# Patient Record
Sex: Female | Born: 1960 | Race: White | Hispanic: No | Marital: Single | Smoking: Former smoker
Health system: Southern US, Community
[De-identification: ages and names within clinical notes are randomized; demographics above are authoritative.]

## PROBLEM LIST (undated history)

## (undated) DIAGNOSIS — D229 Melanocytic nevi, unspecified: Secondary | ICD-10-CM

## (undated) DIAGNOSIS — E785 Hyperlipidemia, unspecified: Secondary | ICD-10-CM

## (undated) DIAGNOSIS — D219 Benign neoplasm of connective and other soft tissue, unspecified: Secondary | ICD-10-CM

## (undated) DIAGNOSIS — R51 Headache: Secondary | ICD-10-CM

## (undated) DIAGNOSIS — M1990 Unspecified osteoarthritis, unspecified site: Secondary | ICD-10-CM

## (undated) DIAGNOSIS — R109 Unspecified abdominal pain: Secondary | ICD-10-CM

## (undated) DIAGNOSIS — E7800 Pure hypercholesterolemia, unspecified: Secondary | ICD-10-CM

## (undated) DIAGNOSIS — F419 Anxiety disorder, unspecified: Secondary | ICD-10-CM

## (undated) HISTORY — DX: Hyperlipidemia, unspecified: E78.5

## (undated) HISTORY — DX: Unspecified osteoarthritis, unspecified site: M19.90

## (undated) HISTORY — DX: Unspecified abdominal pain: R10.9

## (undated) HISTORY — PX: NO PAST SURGERIES: SHX2092

## (undated) HISTORY — DX: Benign neoplasm of connective and other soft tissue, unspecified: D21.9

## (undated) HISTORY — DX: Anxiety disorder, unspecified: F41.9

## (undated) HISTORY — PX: MOLE REMOVAL: SHX2046

## (undated) HISTORY — DX: Melanocytic nevi, unspecified: D22.9

## (undated) HISTORY — DX: Pure hypercholesterolemia, unspecified: E78.00

## (undated) HISTORY — PX: MOUTH SURGERY: SHX715

## (undated) HISTORY — DX: Headache: R51

---

## 2007-02-27 ENCOUNTER — Other Ambulatory Visit: Admission: RE | Admit: 2007-02-27 | Discharge: 2007-02-27 | Payer: Self-pay | Admitting: Obstetrics and Gynecology

## 2008-03-06 ENCOUNTER — Other Ambulatory Visit: Admission: RE | Admit: 2008-03-06 | Discharge: 2008-03-06 | Payer: Self-pay | Admitting: Obstetrics and Gynecology

## 2008-06-12 ENCOUNTER — Ambulatory Visit (HOSPITAL_COMMUNITY): Admission: RE | Admit: 2008-06-12 | Discharge: 2008-06-12 | Payer: Self-pay | Admitting: Obstetrics & Gynecology

## 2009-03-20 ENCOUNTER — Observation Stay (HOSPITAL_COMMUNITY): Admission: EM | Admit: 2009-03-20 | Discharge: 2009-03-21 | Payer: Self-pay | Admitting: Emergency Medicine

## 2009-04-16 ENCOUNTER — Other Ambulatory Visit: Admission: RE | Admit: 2009-04-16 | Discharge: 2009-04-16 | Payer: Self-pay | Admitting: Obstetrics and Gynecology

## 2009-12-22 DIAGNOSIS — D229 Melanocytic nevi, unspecified: Secondary | ICD-10-CM

## 2009-12-22 HISTORY — DX: Melanocytic nevi, unspecified: D22.9

## 2010-04-04 LAB — CBC
HCT: 35.8 % — ABNORMAL LOW (ref 36.0–46.0)
HCT: 37.5 % (ref 36.0–46.0)
MCHC: 35.3 g/dL (ref 30.0–36.0)
MCV: 91.7 fL (ref 78.0–100.0)
RDW: 13.2 % (ref 11.5–15.5)
WBC: 4.9 10*3/uL (ref 4.0–10.5)
WBC: 6.3 10*3/uL (ref 4.0–10.5)

## 2010-04-04 LAB — BASIC METABOLIC PANEL
BUN: 12 mg/dL (ref 6–23)
CO2: 25 mEq/L (ref 19–32)
Calcium: 9.1 mg/dL (ref 8.4–10.5)
Chloride: 106 mEq/L (ref 96–112)
Glucose, Bld: 91 mg/dL (ref 70–99)

## 2010-04-04 LAB — CARDIAC PANEL(CRET KIN+CKTOT+MB+TROPI)
CK, MB: 0.4 ng/mL (ref 0.3–4.0)
CK, MB: 0.5 ng/mL (ref 0.3–4.0)
Total CK: 21 U/L (ref 7–177)
Total CK: 26 U/L (ref 7–177)
Troponin I: <0.01 ng/mL (ref 0.00–0.06)

## 2010-04-04 LAB — DIFFERENTIAL
Basophils Absolute: 0.1 10*3/uL (ref 0.0–0.1)
Eosinophils Absolute: 0 10*3/uL (ref 0.0–0.7)
Eosinophils Absolute: 0.1 10*3/uL (ref 0.0–0.7)
Eosinophils Relative: 1 % (ref 0–5)
Lymphocytes Relative: 19 % (ref 12–46)
Lymphs Abs: 1.2 10*3/uL (ref 0.7–4.0)
Monocytes Absolute: 0.5 10*3/uL (ref 0.1–1.0)
Neutro Abs: 4.5 10*3/uL (ref 1.7–7.7)

## 2010-04-04 LAB — LIPID PANEL: Triglycerides: 170 mg/dL — ABNORMAL HIGH (ref <150)

## 2010-04-04 LAB — COMPREHENSIVE METABOLIC PANEL
AST: 15 U/L (ref 0–37)
Alkaline Phosphatase: 27 U/L — ABNORMAL LOW (ref 39–117)
CO2: 23 mEq/L (ref 19–32)
Calcium: 8.5 mg/dL (ref 8.4–10.5)
Creatinine, Ser: 0.65 mg/dL (ref 0.4–1.2)
GFR calc non Af Amer: >60 mL/min (ref >60)
Sodium: 136 mEq/L (ref 135–145)
Total Protein: 6.3 g/dL (ref 6.0–8.3)

## 2010-04-04 LAB — GLUCOSE, CAPILLARY
Glucose-Capillary: 93 mg/dL (ref 70–99)
Glucose-Capillary: 98 mg/dL (ref 70–99)

## 2010-04-04 LAB — HEMOGLOBIN A1C: Hgb A1c MFr Bld: 5.5 % (ref 4.6–6.1)

## 2010-04-04 LAB — TSH: TSH: 2.547 u[IU]/mL (ref 0.350–4.500)

## 2010-04-04 LAB — POCT CARDIAC MARKERS: Myoglobin, poc: 35.8 ng/mL (ref 12–200)

## 2010-04-04 LAB — T4, FREE: Free T4: 1.38 ng/dL (ref 0.80–1.80)

## 2010-04-22 ENCOUNTER — Other Ambulatory Visit: Payer: Self-pay | Admitting: Adult Health

## 2010-04-22 ENCOUNTER — Other Ambulatory Visit (HOSPITAL_COMMUNITY)
Admission: RE | Admit: 2010-04-22 | Discharge: 2010-04-22 | Disposition: A | Discharge status: Home / Self Care | Payer: Self-pay | Source: Ambulatory Visit | Attending: Obstetrics and Gynecology | Admitting: Obstetrics and Gynecology

## 2010-04-22 DIAGNOSIS — Z01419 Encounter for gynecological examination (general) (routine) without abnormal findings: Secondary | ICD-10-CM | POA: Insufficient documentation

## 2010-09-02 IMAGING — CR DG CHEST 1V PORT
1 series · 1 of 1 positions shown · non-contrast
Comparison: None

CLINICAL DATA: Chest pain

PORTABLE CHEST - 1 VIEW

[view not recorded]
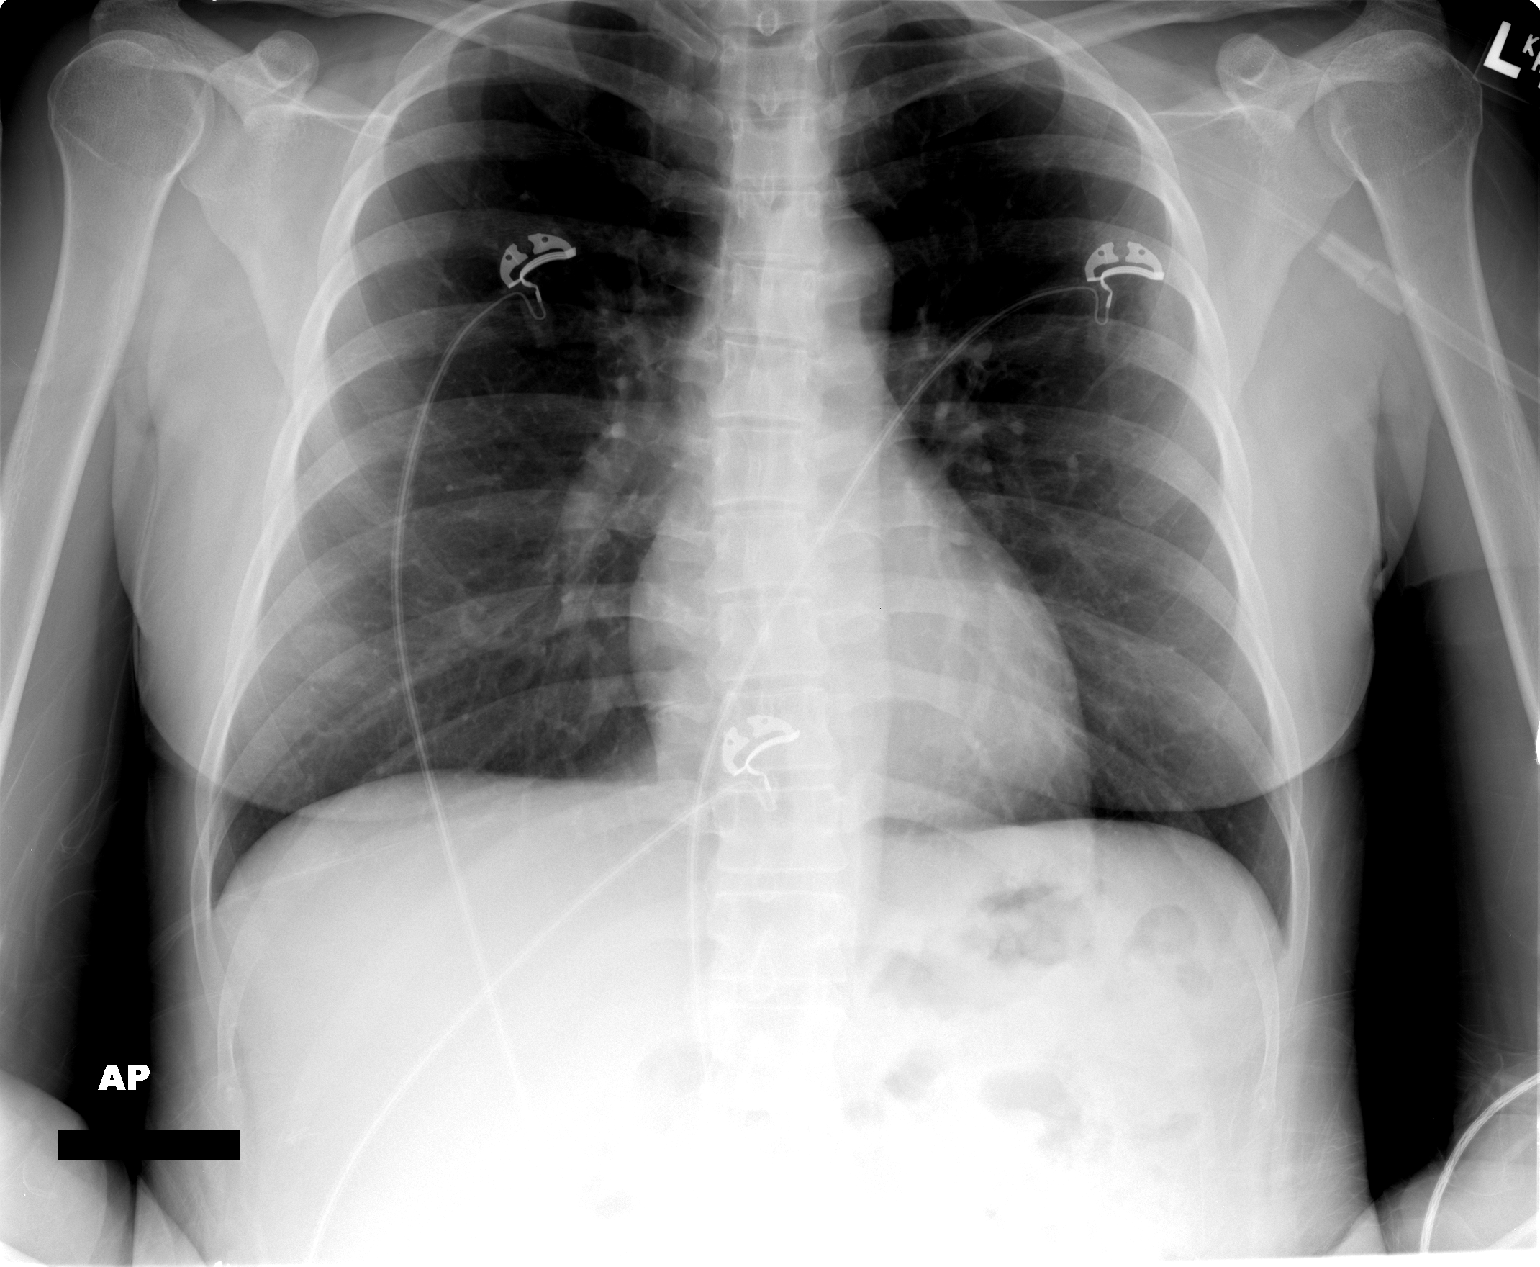

[1 of 1 positions shown; findings below may reference images not displayed]

FINDINGS: The heart size and mediastinal contours are within normal
limits.  Both lungs are clear.  The visualized skeletal structures
are unremarkable.
IMPRESSION: No acute findings.

## 2012-05-09 ENCOUNTER — Encounter: Payer: Self-pay | Admitting: *Deleted

## 2012-05-10 ENCOUNTER — Ambulatory Visit (INDEPENDENT_AMBULATORY_CARE_PROVIDER_SITE_OTHER): Payer: PRIVATE HEALTH INSURANCE | Admitting: Adult Health

## 2012-05-10 ENCOUNTER — Other Ambulatory Visit (HOSPITAL_COMMUNITY)
Admission: RE | Admit: 2012-05-10 | Discharge: 2012-05-10 | Disposition: A | Discharge status: Home / Self Care | Payer: Self-pay | Source: Ambulatory Visit | Attending: Adult Health | Admitting: Adult Health

## 2012-05-10 ENCOUNTER — Encounter: Payer: Self-pay | Admitting: Adult Health

## 2012-05-10 VITALS — BP 120/80 | HR 72 | Ht 61.5 in | Wt 125.0 lb

## 2012-05-10 DIAGNOSIS — Z1151 Encounter for screening for human papillomavirus (HPV): Secondary | ICD-10-CM | POA: Insufficient documentation

## 2012-05-10 DIAGNOSIS — Z01419 Encounter for gynecological examination (general) (routine) without abnormal findings: Secondary | ICD-10-CM | POA: Insufficient documentation

## 2012-05-10 DIAGNOSIS — IMO0001 Reserved for inherently not codable concepts without codable children: Secondary | ICD-10-CM

## 2012-05-10 DIAGNOSIS — E7800 Pure hypercholesterolemia, unspecified: Secondary | ICD-10-CM

## 2012-05-10 DIAGNOSIS — Z8639 Personal history of other endocrine, nutritional and metabolic disease: Secondary | ICD-10-CM

## 2012-05-10 DIAGNOSIS — Z309 Encounter for contraceptive management, unspecified: Secondary | ICD-10-CM

## 2012-05-10 DIAGNOSIS — Z1212 Encounter for screening for malignant neoplasm of rectum: Secondary | ICD-10-CM

## 2012-05-10 HISTORY — DX: Pure hypercholesterolemia, unspecified: E78.00

## 2012-05-10 LAB — HEMOCCULT GUIAC POC 1CARD (OFFICE): Fecal Occult Blood, POC: NEGATIVE

## 2012-05-10 MED ORDER — LEVONORGEST-ETH ESTRAD 91-DAY 0.15-0.03 MG PO TABS: 1.0000 | ORAL_TABLET | Freq: Every day | ORAL | Status: DC

## 2012-05-10 NOTE — Progress Notes (Signed)
Patient ID: Rhonda [name], female   DOB: 1960/08/08, 52 y.o.   MRN: [id number] History of Present Illness: Mareli is a 52 year old white female in for pap and physical.  Current Medications, Allergies, Past Medical History, Past Surgical History, Family History and Social History were reviewed in [employer] electronic medical record.    Review of Systems: Patient denies any headaches, blurred vision, shortness of breath, chest pain, abdominal pain, problems with bowel movements, urination, or intercourse. No joint pain or mood changes.  Physical Exam:Blood pressure 120/80, pulse 72, height 5' 1.5" (1.562 m), weight 125 lb (56.7 kg). General:  Well developed, well nourished, no acute distress Skin:  Warm and dry Neck:  Midline trachea, normal thyroid Lungs; Clear to auscultation bilaterally Breast:  No dominant palpable mass, retraction, or nipple discharge, scar right breast where mole removed recently and it was benign. Cardiovascular: Regular rate and rhythm Abdomen:  Soft, non tender, no hepatosplenomegaly Pelvic:  External genitalia is normal in appearance.  The vagina is normal in appearance. The cervix is bulbous.  Uterus is felt to be normal size, shape, and contour.  No adnexal masses or tenderness noted. Rectal: Good sphincter tone, no polyps, or hemorrhoids felt.  Hemoccult negative. Extremities:  No swelling or varicosities noted Psych:   Pleasant, alert and cooperative   Impression: Yearly exam Contraceptive management History elevated cholesterol History diabetes diet controlled  Plan: Physical in 1 year Mammogram yearly Labs at work Colonoscopy at 52  Refilled [address]

## 2012-05-10 NOTE — Patient Instructions (Addendum)
Follow up 1 year for physical Sign up for my chart Mammogram yearly Labs at work Colonoscopy at 52

## 2012-07-06 ENCOUNTER — Other Ambulatory Visit: Payer: Self-pay | Admitting: Adult Health

## 2013-05-19 ENCOUNTER — Encounter: Payer: Self-pay | Admitting: Adult Health

## 2013-05-19 ENCOUNTER — Ambulatory Visit (INDEPENDENT_AMBULATORY_CARE_PROVIDER_SITE_OTHER): Payer: PRIVATE HEALTH INSURANCE | Admitting: Adult Health

## 2013-05-19 VITALS — BP 120/82 | HR 74 | Ht 61.5 in | Wt 134.0 lb

## 2013-05-19 DIAGNOSIS — Z01419 Encounter for gynecological examination (general) (routine) without abnormal findings: Secondary | ICD-10-CM

## 2013-05-19 DIAGNOSIS — Z1212 Encounter for screening for malignant neoplasm of rectum: Secondary | ICD-10-CM

## 2013-05-19 DIAGNOSIS — Z139 Encounter for screening, unspecified: Secondary | ICD-10-CM

## 2013-05-19 DIAGNOSIS — Z309 Encounter for contraceptive management, unspecified: Secondary | ICD-10-CM

## 2013-05-19 LAB — HEMOCCULT GUIAC POC 1CARD (OFFICE): FECAL OCCULT BLD: NEGATIVE

## 2013-05-19 MED ORDER — LEVONORGEST-ETH ESTRAD 91-DAY 0.15-0.03 MG PO TABS: ORAL_TABLET | ORAL | Status: DC

## 2013-05-19 NOTE — Progress Notes (Signed)
Patient ID: Rhonda [name], female   DOB: 1960/08/28, 53 y.o.   MRN: [id number] History of Present Illness: Rhonda [name] is a 53 year old white female in for a physical,she had a normal pap with negative HPV 05/10/12.   Current Medications, Allergies, Past Medical History, Past Surgical History, Family History and Social History were reviewed in [employer] record.     Review of Systems: Patient denies any headaches, blurred vision, shortness of breath, chest pain, abdominal pain, problems with bowel movements, urination, or intercourse. No joint pain or mood swings.    Physical Exam:BP 120/82  Pulse 74  Ht 5' 1.5" (1.562 m)  Wt 134 lb (60.782 kg)  BMI 24.91 kg/m2 General:  Well developed, well nourished, no acute distress Skin:  Warm and dry Neck:  Midline trachea, normal thyroid Lungs; Clear to auscultation bilaterally Breast:  No dominant palpable mass, retraction, or nipple discharge Cardiovascular: Regular rate and rhythm Abdomen:  Soft, non tender, no hepatosplenomegaly Pelvic:  External genitalia is normal in appearance.  The vagina is normal in appearance.  The cervix is bulbous.  Uterus is felt to be normal size, shape, and contour.  No adnexal masses or tenderness noted. Rectal: Good sphincter tone, no polyps, or hemorrhoids felt.  Hemoccult negative. Extremities:  No swelling or varicosities noted Psych:  No mood changes, alert and cooperative seems happy   Impression: Yearly gyn exam no pap Contraceptive management    Plan: Physical in 1 year Mammogram yearly Labs at work Referred for colonoscopy with Dr Laural Golden Refilled [facility] x 1 year

## 2013-05-19 NOTE — Patient Instructions (Signed)
Physical in 1 year Mammogram yearly Labs at work Referred for colonoscopy

## 2013-05-22 ENCOUNTER — Encounter (INDEPENDENT_AMBULATORY_CARE_PROVIDER_SITE_OTHER): Payer: Self-pay | Admitting: *Deleted

## 2013-07-14 ENCOUNTER — Telehealth: Payer: Self-pay | Admitting: *Deleted

## 2013-07-14 NOTE — Telephone Encounter (Signed)
Needs refill on xanax, wait for PCP to fill but don't run out.

## 2013-10-07 ENCOUNTER — Telehealth: Payer: Self-pay | Admitting: Adult Health

## 2013-10-07 ENCOUNTER — Other Ambulatory Visit: Payer: Self-pay | Admitting: Adult Health

## 2013-10-07 DIAGNOSIS — Z139 Encounter for screening, unspecified: Secondary | ICD-10-CM

## 2013-10-07 NOTE — Telephone Encounter (Signed)
Pt has not heard from Dr Olevia Perches office for colonoscopy, re did referral and gave pt the number to call.

## 2013-10-08 ENCOUNTER — Telehealth: Payer: Self-pay | Admitting: Adult Health

## 2013-10-08 NOTE — Telephone Encounter (Signed)
Told her to call Dr Otelia Limes office to schedule colonoscopy they called and said they sent letter in May with no response

## 2013-10-09 ENCOUNTER — Encounter (INDEPENDENT_AMBULATORY_CARE_PROVIDER_SITE_OTHER): Payer: Self-pay | Admitting: *Deleted

## 2013-10-15 ENCOUNTER — Other Ambulatory Visit (INDEPENDENT_AMBULATORY_CARE_PROVIDER_SITE_OTHER): Payer: Self-pay | Admitting: *Deleted

## 2013-10-15 ENCOUNTER — Encounter (INDEPENDENT_AMBULATORY_CARE_PROVIDER_SITE_OTHER): Payer: Self-pay | Admitting: *Deleted

## 2013-10-15 DIAGNOSIS — Z1211 Encounter for screening for malignant neoplasm of colon: Secondary | ICD-10-CM

## 2013-11-10 ENCOUNTER — Encounter: Payer: Self-pay | Admitting: Adult Health

## 2013-11-12 ENCOUNTER — Telehealth (INDEPENDENT_AMBULATORY_CARE_PROVIDER_SITE_OTHER): Payer: Self-pay | Admitting: *Deleted

## 2013-11-12 DIAGNOSIS — Z1211 Encounter for screening for malignant neoplasm of colon: Secondary | ICD-10-CM

## 2013-11-12 NOTE — Telephone Encounter (Signed)
Patient needs movi prep 

## 2013-11-19 MED ORDER — PEG-KCL-NACL-NASULF-NA ASC-C 100 G PO SOLR: 1.0000 | Freq: Once | ORAL | Status: DC

## 2013-12-11 ENCOUNTER — Encounter (INDEPENDENT_AMBULATORY_CARE_PROVIDER_SITE_OTHER): Payer: Self-pay | Admitting: *Deleted

## 2013-12-17 ENCOUNTER — Encounter: Payer: Self-pay | Admitting: Adult Health

## 2014-02-03 ENCOUNTER — Telehealth (INDEPENDENT_AMBULATORY_CARE_PROVIDER_SITE_OTHER): Payer: Self-pay | Admitting: *Deleted

## 2014-02-03 NOTE — Telephone Encounter (Signed)
agree

## 2014-02-03 NOTE — Telephone Encounter (Signed)
Referring MD: Derrek Monaco, [facility]   PCP: [facility]   Procedure: tcs  Reason/Indication:  screening  Has patient had this procedure before?  no  If so, when, by whom and where?    Is there a family history of colon cancer?  no  Who?  What age when diagnosed?    Is patient diabetic?   Borderline, diet control      Does patient have prosthetic heart valve?  no  Do you have a pacemaker?  no  Has patient ever had endocarditis? no  Has patient had joint replacement within last 12 months?  no  Does patient tend to be constipated or take laxatives? no  Is patient on Coumadin, Plavix and/or Aspirin? no  Medications: Birth control daily, simvastatin 10 mg daily, otc Nexium daily, xanax 0.35 mg prn  Allergies: low tolerance to pain medication  Medication Adjustment:   Procedure date & time: 03/05/14 at 930

## 2014-03-04 ENCOUNTER — Other Ambulatory Visit (INDEPENDENT_AMBULATORY_CARE_PROVIDER_SITE_OTHER): Payer: Self-pay | Admitting: *Deleted

## 2014-03-04 ENCOUNTER — Telehealth: Payer: Self-pay | Admitting: Adult Health

## 2014-03-04 DIAGNOSIS — Z1211 Encounter for screening for malignant neoplasm of colon: Secondary | ICD-10-CM

## 2014-03-04 MED ORDER — SCOPOLAMINE 1 MG/3DAYS TD PT72: 1.0000 | MEDICATED_PATCH | TRANSDERMAL | Status: DC

## 2014-03-04 NOTE — Telephone Encounter (Signed)
Having colonoscopy in am, gets sick with pain meds and dizzy will rx transderm scop patch x 1 put on tonight

## 2014-03-05 ENCOUNTER — Encounter (HOSPITAL_COMMUNITY)
Admission: RE | Disposition: A | Discharge status: Home / Self Care | Payer: Self-pay | Source: Ambulatory Visit | Attending: Internal Medicine

## 2014-03-05 ENCOUNTER — Ambulatory Visit (HOSPITAL_COMMUNITY)
Admission: RE | Admit: 2014-03-05 | Discharge: 2014-03-05 | Disposition: A | Discharge status: Home / Self Care | Payer: PRIVATE HEALTH INSURANCE | Source: Ambulatory Visit | Attending: Internal Medicine | Admitting: Internal Medicine

## 2014-03-05 ENCOUNTER — Encounter (HOSPITAL_COMMUNITY): Payer: Self-pay | Admitting: *Deleted

## 2014-03-05 DIAGNOSIS — E785 Hyperlipidemia, unspecified: Secondary | ICD-10-CM | POA: Insufficient documentation

## 2014-03-05 DIAGNOSIS — Z79899 Other long term (current) drug therapy: Secondary | ICD-10-CM | POA: Diagnosis not present

## 2014-03-05 DIAGNOSIS — Z87891 Personal history of nicotine dependence: Secondary | ICD-10-CM | POA: Insufficient documentation

## 2014-03-05 DIAGNOSIS — Z1211 Encounter for screening for malignant neoplasm of colon: Secondary | ICD-10-CM | POA: Diagnosis present

## 2014-03-05 DIAGNOSIS — F419 Anxiety disorder, unspecified: Secondary | ICD-10-CM | POA: Insufficient documentation

## 2014-03-05 DIAGNOSIS — K644 Residual hemorrhoidal skin tags: Secondary | ICD-10-CM | POA: Insufficient documentation

## 2014-03-05 DIAGNOSIS — K6389 Other specified diseases of intestine: Secondary | ICD-10-CM

## 2014-03-05 DIAGNOSIS — E780 Pure hypercholesterolemia: Secondary | ICD-10-CM | POA: Diagnosis not present

## 2014-03-05 DIAGNOSIS — G43909 Migraine, unspecified, not intractable, without status migrainosus: Secondary | ICD-10-CM | POA: Insufficient documentation

## 2014-03-05 DIAGNOSIS — M1990 Unspecified osteoarthritis, unspecified site: Secondary | ICD-10-CM | POA: Diagnosis not present

## 2014-03-05 DIAGNOSIS — K6289 Other specified diseases of anus and rectum: Secondary | ICD-10-CM | POA: Insufficient documentation

## 2014-03-05 DIAGNOSIS — K649 Unspecified hemorrhoids: Secondary | ICD-10-CM

## 2014-03-05 DIAGNOSIS — Z791 Long term (current) use of non-steroidal anti-inflammatories (NSAID): Secondary | ICD-10-CM | POA: Diagnosis not present

## 2014-03-05 HISTORY — PX: COLONOSCOPY: SHX5424

## 2014-03-05 SURGERY — COLONOSCOPY
Anesthesia: Moderate Sedation

## 2014-03-05 MED ORDER — SODIUM CHLORIDE 0.9 % IV SOLN
INTRAVENOUS | Status: DC
  Administered 2014-03-05: 09:00:00 via INTRAVENOUS

## 2014-03-05 MED ORDER — MEPERIDINE HCL 50 MG/ML IJ SOLN
INTRAMUSCULAR | Status: DC | PRN
  Administered 2014-03-05: 25 mg via INTRAVENOUS
  Administered 2014-03-05: 25 mg

## 2014-03-05 MED ORDER — MIDAZOLAM HCL 5 MG/5ML IJ SOLN
INTRAMUSCULAR | Status: AC
  Filled 2014-03-05: qty 5

## 2014-03-05 MED ORDER — MIDAZOLAM HCL 5 MG/5ML IJ SOLN
INTRAMUSCULAR | Status: DC | PRN
Start: 2014-03-05 — End: 2014-03-05
  Administered 2014-03-05 (×6): 2 mg via INTRAVENOUS
  Administered 2014-03-05: 1 mg via INTRAVENOUS
  Administered 2014-03-05: 2 mg via INTRAVENOUS

## 2014-03-05 MED ORDER — MEPERIDINE HCL 50 MG/ML IJ SOLN
INTRAMUSCULAR | Status: AC
  Filled 2014-03-05: qty 1

## 2014-03-05 MED ORDER — MIDAZOLAM HCL 5 MG/5ML IJ SOLN
INTRAMUSCULAR | Status: DC
Start: 2014-03-05 — End: 2014-03-05
  Filled 2014-03-05: qty 10

## 2014-03-05 NOTE — Discharge Instructions (Signed)
Resume usual medications and high fiber diet. No driving for 24 hours. Next screening exam in 10 years.  Colonoscopy, Care After Refer to this sheet in the next few weeks. These instructions provide you with information on caring for yourself after your procedure. Your health care provider may also give you more specific instructions. Your treatment has been planned according to current medical practices, but problems sometimes occur. Call your health care provider if you have any problems or questions after your procedure. WHAT TO EXPECT AFTER THE PROCEDURE  After your procedure, it is typical to have the following:  A small amount of blood in your stool.  Moderate amounts of gas and mild abdominal cramping or bloating. HOME CARE INSTRUCTIONS  Do not drive, operate machinery, or sign important documents for 24 hours.  You may shower and resume your regular physical activities, but move at a slower pace for the first 24 hours.  Take frequent rest periods for the first 24 hours.  Walk around or put a warm pack on your abdomen to help reduce abdominal cramping and bloating.  Drink enough fluids to keep your urine clear or pale yellow.  You may resume your normal diet as instructed by your health care provider. Avoid heavy or fried foods that are hard to digest.  Avoid drinking alcohol for 24 hours or as instructed by your health care provider.  Only take over-the-counter or prescription medicines as directed by your health care provider.  If a tissue sample (biopsy) was taken during your procedure:  Do not take aspirin or blood thinners for 7 days, or as instructed by your health care provider.  Do not drink alcohol for 7 days, or as instructed by your health care provider.  Eat soft foods for the first 24 hours. SEEK MEDICAL CARE IF: You have persistent spotting of blood in your stool 2-3 days after the procedure. SEEK IMMEDIATE MEDICAL CARE IF:  You have more than a small  spotting of blood in your stool.  You pass large blood clots in your stool.  Your abdomen is swollen (distended).  You have nausea or vomiting.  You have a fever.  You have increasing abdominal pain that is not relieved with medicine. Document Released: 08/10/2003 Document Revised: 10/16/2012 Document Reviewed: 09/02/2012 [facility] Patient Information 2015 [place], [place]. This information is not intended to replace advice given to you by your health care provider. Make sure you discuss any questions you have with your health care provider.

## 2014-03-05 NOTE — H&P (Signed)
Rhonda [name] is an 54 y.o. female.   Chief Complaint: Patient's here for colonoscopy. HPI: Patient is 54 year old Caucasian female who is in for screening colonoscopy. She denies abdominal pain change in bowel habits or rectal bleeding. Family history is negative for CRC.  Past Medical History  Diagnosis Date  . Headache(784.0)     migraines  . Anxiety   . Hyperlipidemia   . Arthritis   . Elevated cholesterol 05/10/2012    Past Surgical History  Procedure Laterality Date  . No past surgeries    . Mole removal      Family History  Problem Relation Age of Onset  . Cancer Paternal Grandfather     prostate  . Cancer Paternal Grandmother     breast  . CAD Maternal Grandmother   . Diabetes Father   . Hyperlipidemia Father   . Hypertension Father    Social History:  reports that she has quit smoking. Her smoking use included Cigarettes. She has a 3.75 pack-year smoking history. She has never used smokeless tobacco. She reports that she drinks alcohol. She reports that she does not use illicit drugs.  Allergies:  Allergies  Allergen Reactions  . Codeine     Medications Prior to Admission  Medication Sig Dispense Refill  . ALPRAZolam (XANAX) 0.25 MG tablet Take 0.25 mg by mouth daily as needed for anxiety.    Marland Kitchen esomeprazole (NEXIUM) 20 MG capsule Take 20 mg by mouth daily.    . Glucosamine-Chondroitin (MOVE FREE PO) Take 1 capsule by mouth 2 (two) times daily.    Marland Kitchen ibuprofen (ADVIL,MOTRIN) 800 MG tablet Take 800 mg by mouth every 8 (eight) hours as needed for moderate pain.    Marland Kitchen levonorgestrel-ethinyl estradiol (JOLESSA) 0.15-0.03 MG tablet TAKE (1) TABLET BY MOUTH ONCE DAILY AS DIRECTED. 91 tablet PRN  . peg 3350 powder (MOVIPREP) 100 G SOLR Take 1 kit (200 g total) by mouth once. 1 kit 0  . scopolamine (TRANSDERM-SCOP) 1 MG/3DAYS Place 1 patch (1.5 mg total) onto the skin every 3 (three) days. 1 patch 0  . simvastatin (ZOCOR) 10 MG tablet Take 10 mg by mouth daily.    .  Multiple Vitamins-Minerals (MULTIVITAMINS THER. W/MINERALS) TABS tablet Take 1 tablet by mouth daily.      No results found for this or any previous visit (from the past 48 hour(s)). No results found.  ROS  Blood pressure 132/84, pulse 89, temperature 97.8 F (36.6 C), temperature source Oral, resp. rate 16, height 5' 1" (1.549 m), weight 138 lb (62.596 kg), SpO2 100 %. Physical Exam  Constitutional: She appears well-developed and well-nourished.  HENT:  Mouth/Throat: Oropharynx is clear and moist.  Eyes: Conjunctivae are normal. No scleral icterus.  Neck: No thyromegaly present.  Cardiovascular: Normal rate, regular rhythm and normal heart sounds.   No murmur heard. Respiratory: Effort normal and breath sounds normal.  GI: Soft. She exhibits no distension and no mass. There is no tenderness.  Musculoskeletal: She exhibits no edema.  Lymphadenopathy:    She has no cervical adenopathy.  Neurological: She is alert.  Skin: Skin is warm and dry.     Assessment/Plan Average risk screening colonoscopy.  Marino Rogerson U 03/05/2014, 9:10 AM

## 2014-03-05 NOTE — Op Note (Addendum)
COLONOSCOPY PROCEDURE REPORT  PATIENT:  Rhonda [name]  MR#:  [id number] Birthdate:  Oct 26, 1960, 54 y.o., female Endoscopist:  Dr. Rogene Houston, MD Referred By:   Ms. Derrek Monaco, NP  Procedure Date: 03/05/2014  Procedure:   Colonoscopy  Indications:  Patient is 54 year old Caucasian female was undergoing average risk screening colonoscopy.  Informed Consent:  The procedure and risks were reviewed with the patient and informed consent was obtained.  Medications:  Demerol 50 mg IV Versed 15 mg IV  Description of procedure:  After a digital rectal exam was performed, that colonoscope was advanced from the anus through the rectum and colon to the area of the cecum, ileocecal valve and appendiceal orifice. The cecum was deeply intubated. These structures were well-seen and photographed for the record. From the level of the cecum and ileocecal valve, the scope was slowly and cautiously withdrawn. The mucosal surfaces were carefully surveyed utilizing scope tip to flexion to facilitate fold flattening as needed. The scope was pulled down into the rectum where a thorough exam including retroflexion was performed. Terminal ileum was also examined.  Findings:   Prep satisfactory. Normal mucosa of terminal ileum. Normal mucosa of cecum and ascending colon hepatic flexure and transverse colon. Mild pigmentation noted to mucosa of descending and sigmoid colon. Normal rectal mucosa. Small hemorrhoids below the dentate line along with single anal papilla.   Therapeutic/Diagnostic Maneuvers Performed: None  Complications:  None  Cecal Withdrawal Time:  8 minutes  Impression:  Normal mucosa of terminal ileum. Mild melanosis coli. Small external hemorrhoids and single anal papilla.  Recommendations:  Standard instructions given. Next screening exam in 10 years.  Matti Minney U  03/05/2014 10:08 AM  CC: Dr. Rocky Morel, MD

## 2014-03-06 ENCOUNTER — Encounter (HOSPITAL_COMMUNITY): Payer: Self-pay | Admitting: Internal Medicine

## 2014-05-27 ENCOUNTER — Ambulatory Visit (INDEPENDENT_AMBULATORY_CARE_PROVIDER_SITE_OTHER): Payer: PRIVATE HEALTH INSURANCE | Admitting: Adult Health

## 2014-05-27 ENCOUNTER — Encounter: Payer: Self-pay | Admitting: Adult Health

## 2014-05-27 VITALS — BP 120/78 | HR 108 | Ht 61.25 in | Wt 132.5 lb

## 2014-05-27 DIAGNOSIS — Z01419 Encounter for gynecological examination (general) (routine) without abnormal findings: Secondary | ICD-10-CM | POA: Diagnosis not present

## 2014-05-27 DIAGNOSIS — Z1212 Encounter for screening for malignant neoplasm of rectum: Secondary | ICD-10-CM | POA: Diagnosis not present

## 2014-05-27 DIAGNOSIS — Z3041 Encounter for surveillance of contraceptive pills: Secondary | ICD-10-CM

## 2014-05-27 DIAGNOSIS — R109 Unspecified abdominal pain: Secondary | ICD-10-CM

## 2014-05-27 HISTORY — DX: Unspecified abdominal pain: R10.9

## 2014-05-27 LAB — HEMOCCULT GUIAC POC 1CARD (OFFICE): Fecal Occult Blood, POC: NEGATIVE

## 2014-05-27 MED ORDER — LEVONORGEST-ETH ESTRAD 91-DAY 0.15-0.03 MG PO TABS: ORAL_TABLET | ORAL | Status: DC

## 2014-05-27 NOTE — Progress Notes (Addendum)
Patient ID: Rhonda [name], female   DOB: 1960-08-01, 54 y.o.   MRN: [id number] History of Present Illness: Rhonda [name] is a 54 year old white female, in for well woman gyn exam.She had a normal pap with negative HPV 05/10/12.She is complaining of abdominal cramping and has tried probiotic.   Current Medications, Allergies, Past Medical History, Past Surgical History, Family History and Social History were reviewed in [employer] record.     Review of Systems: Patient denies any headaches, hearing loss, fatigue, blurred vision, shortness of breath, chest pain, abdominal pain, problems with bowel movements, urination, or intercourse(not having sex at present). No joint pain or mood swings.Happy with OCs and wants to continue. She had colonoscopy this year with Dr Laural Golden.   Physical Exam:BP 120/78 mmHg  Pulse 108  Ht 5' 1.25" (1.556 m)  Wt 132 lb 8 oz (60.102 kg)  BMI 24.82 kg/m2 General:  Well developed, well nourished, no acute distress Skin:  Warm and dry Neck:  Midline trachea, normal thyroid, good ROM, no lymphadenopathy Lungs; Clear to auscultation bilaterally Breast:  No dominant palpable mass, retraction, or nipple discharge Cardiovascular: Regular rate and rhythm Abdomen:  Soft, non tender, no hepatosplenomegaly Pelvic:  External genitalia is normal in appearance, no lesions.  The vagina is normal in appearance. Urethra has no lesions or masses. The cervix is smooth.  Uterus is felt to be normal size, shape, and contour.  No adnexal masses, RLQ tenderness noted.Bladder is non tender, no masses felt.Has mole inner left thigh is stable but is oval and about 1 x 2 cm, her dermatologist has sen it, just watch for now Rectal: Good sphincter tone, no polyps, or hemorrhoids felt.  Hemoccult negative. Extremities/musculoskeletal:  No swelling or varicosities noted, no clubbing or cyanosis Psych:  No mood changes, alert and cooperative,seems happy She declines stopping  jolessa and checking [facility].Will get [place] to assess cramping and RLQ discomfort.  Impression: Well woman gyn exam no pap Contraceptive management Cramping   Plan: Return in 2 weeks for gyn [place] Refilled jolessa x 1 year Pap and physical in 1 year Mammogram yearly Colonoscopy 2026 Labs at work Review handout on pelvic pain

## 2014-05-27 NOTE — Patient Instructions (Signed)
Pap and physical in 1 year Mammogram yearly Colonoscopy 20 26 Return in 2 week for US Pelvic Pain Female pelvic pain can be caused by many different things and start from a variety of places. Pelvic pain refers to pain that is located in the lower half of the abdomen and between your hips. The pain may occur over a short period of time (acute) or may be reoccurring (chronic). The cause of pelvic pain may be related to disorders affecting the female reproductive organs (gynecologic), but it may also be related to the bladder, kidney stones, an intestinal complication, or muscle or skeletal problems. Getting help right away for pelvic pain is important, especially if there has been severe, sharp, or a sudden onset of unusual pain. It is also important to get help right away because some types of pelvic pain can be life threatening.  CAUSES  Below are only some of the causes of pelvic pain. The causes of pelvic pain can be in one of several categories.   Gynecologic.  Pelvic inflammatory disease.  Sexually transmitted infection.  Ovarian cyst or a twisted ovarian ligament (ovarian torsion).  Uterine lining that grows outside the uterus (endometriosis).  Fibroids, cysts, or tumors.  Ovulation.  Pregnancy.  Pregnancy that occurs outside the uterus (ectopic pregnancy).  Miscarriage.  Labor.  Abruption of the placenta or ruptured uterus.  Infection.  Uterine infection (endometritis).  Bladder infection.  Diverticulitis.  Miscarriage related to a uterine infection (septic abortion).  Bladder.  Inflammation of the bladder (cystitis).  Kidney stone(s).  Gastrointestinal.  Constipation.  Diverticulitis.  Neurologic.  Trauma.  Feeling pelvic pain because of mental or emotional causes (psychosomatic).  Cancers of the bowel or pelvis. EVALUATION  Your caregiver will want to take a careful history of your concerns. This includes recent changes in your health, a careful  gynecologic history of your periods (menses), and a sexual history. Obtaining your family history and medical history is also important. Your caregiver may suggest a pelvic exam. A pelvic exam will help identify the location and severity of the pain. It also helps in the evaluation of which organ system may be involved. In order to identify the cause of the pelvic pain and be properly treated, your caregiver may order tests. These tests may include:   A pregnancy test.  Pelvic ultrasonography.  An X-ray exam of the abdomen.  A urinalysis or evaluation of vaginal discharge.  Blood tests. HOME CARE INSTRUCTIONS   Only take over-the-counter or prescription medicines for pain, discomfort, or fever as directed by your caregiver.   Rest as directed by your caregiver.   Eat a balanced diet.   Drink enough fluids to make your urine clear or pale yellow, or as directed.   Avoid sexual intercourse if it causes pain.   Apply warm or cold compresses to the lower abdomen depending on which one helps the pain.   Avoid stressful situations.   Keep a journal of your pelvic pain. Write down when it started, where the pain is located, and if there are things that seem to be associated with the pain, such as food or your menstrual cycle.  Follow up with your caregiver as directed.  SEEK MEDICAL CARE IF:  Your medicine does not help your pain.  You have abnormal vaginal discharge. SEEK IMMEDIATE MEDICAL CARE IF:   You have heavy bleeding from the vagina.   Your pelvic pain increases.   You feel light-headed or faint.   You have chills.  You have pain with urination or blood in your urine.   You have uncontrolled diarrhea or vomiting.   You have a fever or persistent symptoms for more than 3 days.  You have a fever and your symptoms suddenly get worse.   You are being physically or sexually abused.  MAKE SURE YOU:  Understand these instructions.  Will watch your  condition.  Will get help if you are not doing well or get worse. Document Released: 11/23/2003 Document Revised: 05/12/2013 Document Reviewed: 04/17/2011 [facility] Patient Information 2015 [place], [place]. This information is not intended to replace advice given to you by your health care provider. Make sure you discuss any questions you have with your health care provider. Labs at work

## 2014-06-11 ENCOUNTER — Ambulatory Visit (INDEPENDENT_AMBULATORY_CARE_PROVIDER_SITE_OTHER): Payer: PRIVATE HEALTH INSURANCE

## 2014-06-11 DIAGNOSIS — R109 Unspecified abdominal pain: Secondary | ICD-10-CM

## 2014-06-11 NOTE — Progress Notes (Signed)
[place] heterogeneous  retroverted uterus w two fibroids, #1 fundal subserosal fibroid 3 x 3.3 x3.8cm, # 2 post bdy intermural fibroid 3.3x 2 x 2.9 cm,normal ov's bilat, eec 1.59mm

## 2014-06-15 ENCOUNTER — Telehealth: Payer: Self-pay | Admitting: *Deleted

## 2014-06-15 NOTE — Telephone Encounter (Signed)
Pt having some pain in pelvic area and is gassy, had [place] 6/2 that showed 2 fibroids, ovaries are normal, she had chinese today,try advil and is OK to take 1 xanax, if pain not better to call back.

## 2014-06-26 ENCOUNTER — Telehealth: Payer: Self-pay | Admitting: *Deleted

## 2014-06-26 NOTE — Telephone Encounter (Signed)
Pt to call back in line at store

## 2014-06-26 NOTE — Telephone Encounter (Signed)
Pt doing well, just wondered about [place], pt aware of results but has not been read yet by MD

## 2014-07-22 ENCOUNTER — Other Ambulatory Visit: Payer: Self-pay | Admitting: Adult Health

## 2014-09-18 ENCOUNTER — Other Ambulatory Visit: Payer: Self-pay | Admitting: Adult Health

## 2014-09-18 ENCOUNTER — Telehealth: Payer: Self-pay | Admitting: Adult Health

## 2014-09-18 NOTE — Telephone Encounter (Signed)
Refilled ibuprofen

## 2014-09-18 NOTE — Telephone Encounter (Signed)
Spoke with pt. Pt states she is having stomach pains and has had them all week. She has fibroids. Pt is requesting a refill on Ibuprofen 800 mg. Please advise. Thanks!! [place]

## 2014-10-12 ENCOUNTER — Telehealth: Payer: Self-pay | Admitting: Adult Health

## 2014-10-12 MED ORDER — NAPROXEN SODIUM 550 MG PO TABS: 550.0000 mg | ORAL_TABLET | Freq: Two times a day (BID) | ORAL | Status: DC

## 2014-10-12 NOTE — Telephone Encounter (Signed)
Complains of cramps, will rx anaprox ds

## 2014-12-16 ENCOUNTER — Encounter: Payer: Self-pay | Admitting: Adult Health

## 2015-03-29 ENCOUNTER — Other Ambulatory Visit: Payer: Self-pay | Admitting: Adult Health

## 2015-05-28 ENCOUNTER — Ambulatory Visit (INDEPENDENT_AMBULATORY_CARE_PROVIDER_SITE_OTHER): Payer: PRIVATE HEALTH INSURANCE | Admitting: Adult Health

## 2015-05-28 ENCOUNTER — Encounter: Payer: Self-pay | Admitting: Adult Health

## 2015-05-28 ENCOUNTER — Other Ambulatory Visit (HOSPITAL_COMMUNITY)
Admission: RE | Admit: 2015-05-28 | Discharge: 2015-05-28 | Disposition: A | Discharge status: Home / Self Care | Payer: PRIVATE HEALTH INSURANCE | Source: Ambulatory Visit | Attending: Adult Health | Admitting: Adult Health

## 2015-05-28 VITALS — BP 138/82 | HR 96 | Ht 61.5 in | Wt 141.0 lb

## 2015-05-28 DIAGNOSIS — Z01419 Encounter for gynecological examination (general) (routine) without abnormal findings: Secondary | ICD-10-CM

## 2015-05-28 DIAGNOSIS — Z1211 Encounter for screening for malignant neoplasm of colon: Secondary | ICD-10-CM

## 2015-05-28 DIAGNOSIS — Z1151 Encounter for screening for human papillomavirus (HPV): Secondary | ICD-10-CM | POA: Diagnosis present

## 2015-05-28 DIAGNOSIS — Z3041 Encounter for surveillance of contraceptive pills: Secondary | ICD-10-CM

## 2015-05-28 LAB — HEMOCCULT GUIAC POC 1CARD (OFFICE): Fecal Occult Blood, POC: NEGATIVE

## 2015-05-28 MED ORDER — SCOPOLAMINE 1 MG/3DAYS TD PT72
1.0000 | MEDICATED_PATCH | TRANSDERMAL | Status: DC
Start: 2015-05-28 — End: 2016-05-29

## 2015-05-28 MED ORDER — LEVONORGEST-ETH ESTRAD 91-DAY 0.15-0.03 MG PO TABS: 1.0000 | ORAL_TABLET | Freq: Every day | ORAL | Status: DC

## 2015-05-28 NOTE — Patient Instructions (Signed)
Physical in 1 year, pap in 3 years if normal Mammogram yearly  Labs at work Colonoscopy per GI

## 2015-05-28 NOTE — Progress Notes (Signed)
Patient ID: Rhonda [name], female   DOB: [birth year], 55 y.o.   MRN: [id number] History of Present Illness: Rhonda [name] is a 55 year old white female, in for a well woman gyn exam and pap.She is happy with her OCs and requests scop patch, is going on a trip soon.She says anaprox is great for cramping. PCP is [place].  Current Medications, Allergies, Past Medical History, Past Surgical History, Family History and Social History were reviewed in [employer] record.     Review of Systems: Patient denies any headaches, hearing loss, fatigue, blurred vision, shortness of breath, chest pain, abdominal pain, problems with bowel movements, urination, or intercourse(not currently having sex). No joint pain or mood swings.    Physical Exam:BP 138/82 mmHg  Pulse 96  Ht 5' 1.5" (1.562 m)  Wt 141 lb (63.957 kg)  BMI 26.21 kg/m2 General:  Well developed, well nourished, no acute distress Skin:  Warm and dry Neck:  Midline trachea, normal thyroid, good ROM, no lymphadenopathy Lungs; Clear to auscultation bilaterally Breast:  No dominant palpable mass, retraction, or nipple discharge Cardiovascular: Regular rate and rhythm Abdomen:  Soft, non tender, no hepatosplenomegaly Pelvic:  External genitalia is normal in appearance, no lesions.  The vagina is normal in appearance. Urethra has no lesions or masses. The cervix is smooth, pap with HPV performed.  Uterus is felt to be normal size, shape, and contour.  No adnexal masses or tenderness noted.Bladder is non tender, no masses felt. Rectal: Good sphincter tone, no polyps, or hemorrhoids felt.  Hemoccult negative. Extremities/musculoskeletal:  No swelling or varicosities noted, no clubbing or cyanosis, has cyst left palm Psych:  No mood changes, alert and cooperative,seems happy Discussed stopping OCs next year and check [place].  Impression: Well woman gyn exam and pap Contraceptive management    Plan: Refilled Jolessa for 1  year Rx transderm scop patch #4 no refills See dermatologist for cyst on palm Physical in 1 year, pap in 3 if normal Labs at work Mammogram yearly Colonoscopy 2026

## 2015-05-31 LAB — CYTOLOGY - PAP

## 2015-08-10 ENCOUNTER — Telehealth: Payer: Self-pay | Admitting: *Deleted

## 2015-08-10 NOTE — Telephone Encounter (Signed)
Pt called about xanax, she sees new provider at [facility] and they lowered her xanax she says and only gave her 69 and she has run out,instructed her to call them and talk with them about it.

## 2016-04-24 ENCOUNTER — Other Ambulatory Visit: Payer: Self-pay | Admitting: Adult Health

## 2016-05-16 ENCOUNTER — Other Ambulatory Visit: Payer: Self-pay | Admitting: Adult Health

## 2016-05-29 ENCOUNTER — Ambulatory Visit (INDEPENDENT_AMBULATORY_CARE_PROVIDER_SITE_OTHER): Payer: PRIVATE HEALTH INSURANCE | Admitting: Adult Health

## 2016-05-29 ENCOUNTER — Encounter: Payer: Self-pay | Admitting: Adult Health

## 2016-05-29 VITALS — BP 130/88 | HR 106 | Ht 61.5 in | Wt 144.0 lb

## 2016-05-29 DIAGNOSIS — Z1211 Encounter for screening for malignant neoplasm of colon: Secondary | ICD-10-CM

## 2016-05-29 DIAGNOSIS — Z3041 Encounter for surveillance of contraceptive pills: Secondary | ICD-10-CM

## 2016-05-29 DIAGNOSIS — Z01419 Encounter for gynecological examination (general) (routine) without abnormal findings: Secondary | ICD-10-CM | POA: Diagnosis not present

## 2016-05-29 DIAGNOSIS — Z1212 Encounter for screening for malignant neoplasm of rectum: Secondary | ICD-10-CM

## 2016-05-29 DIAGNOSIS — Z1159 Encounter for screening for other viral diseases: Secondary | ICD-10-CM

## 2016-05-29 DIAGNOSIS — Z131 Encounter for screening for diabetes mellitus: Secondary | ICD-10-CM

## 2016-05-29 DIAGNOSIS — Z114 Encounter for screening for human immunodeficiency virus [HIV]: Secondary | ICD-10-CM

## 2016-05-29 DIAGNOSIS — Z13220 Encounter for screening for lipoid disorders: Secondary | ICD-10-CM

## 2016-05-29 LAB — HEMOCCULT GUIAC POC 1CARD (OFFICE): Fecal Occult Blood, POC: NEGATIVE

## 2016-05-29 MED ORDER — LEVONORGEST-ETH ESTRAD 91-DAY 0.15-0.03 MG PO TABS: 1.0000 | ORAL_TABLET | Freq: Every day | ORAL | 3 refills | Status: DC

## 2016-05-29 MED ORDER — NAPROXEN SODIUM 550 MG PO TABS: ORAL_TABLET | ORAL | 2 refills | Status: DC

## 2016-05-29 NOTE — Progress Notes (Signed)
Patient ID: Rhonda [name], female   DOB: 02-25-1960, 56 y.o.   MRN: [id number] History of Present Illness: Zykeriah is a 56 year old white female in for well woman gyn exam, has normal pap with negative HPV  05/28/15.  PCP is Dr Gerarda Fraction.   Current Medications, Allergies, Past Medical History, Past Surgical History, Family History and Social History were reviewed in [facility] record.     Review of Systems: Patient denies any headaches, hearing loss, fatigue, blurred vision, shortness of breath, chest pain, abdominal pain, problems with bowel movements, urination, or intercourse. No joint pain or mood swings.Gained weight around middle, and is eating very healthy. She wants to continue jolessa, no periods or hot flashes,moods good ,and menstrual headaches stopped.     Physical Exam:BP 130/88 (BP Location: Left Arm, Patient Position: Sitting, Cuff Size: Normal)   Pulse (!) 106   Ht 5' 1.5" (1.562 m)   Wt 144 lb (65.3 kg)   BMI 26.77 kg/m  General:  Well developed, well nourished, no acute distress Skin:  Warm and dry Neck:  Midline trachea, normal thyroid, good ROM, no lymphadenopathy Lungs; Clear to auscultation bilaterally Breast:  No dominant palpable mass, retraction, or nipple discharge Cardiovascular: Regular rate and rhythm Abdomen:  Soft, non tender, no hepatosplenomegaly Pelvic:  External genitalia is normal in appearance, no lesions.  The vagina is normal in appearance. Urethra has no lesions or masses. The cervix is smooth.  Uterus is felt to be normal size, shape, and contour.  No adnexal masses or tenderness noted.Bladder is non tender, no masses felt. Rectal: Good sphincter tone, no polyps, or hemorrhoids felt.  Hemoccult negative. Extremities/musculoskeletal:  No swelling or varicosities noted, no clubbing or cyanosis Psych:  No mood changes, alert and cooperative,seems happy PHQ 2 score 0.  Impression: 1. Well woman exam with routine gynecological  exam   2. Screening for colorectal cancer   3. Encounter for surveillance of contraceptive pills   4. Screening for cholesterol level   5. Screening for diabetes mellitus   6. Screening for HIV (human immunodeficiency virus)   7. Encounter for hepatitis C screening test for low risk patient       Plan: Meds ordered this encounter  Medications  . Cholecalciferol (VITAMIN D PO)    Sig: Take by mouth daily.  . naproxen sodium (ANAPROX) 550 MG tablet    Sig: Take 1 with meal bid prn    Dispense:  60 tablet    Refill:  2    Order Specific Question:   Supervising Provider    Answer:   Elonda Husky, LUTHER H [[id number]]  . levonorgestrel-ethinyl estradiol (JOLESSA) 0.15-0.03 MG tablet    Sig: Take 1 tablet by mouth daily.    Dispense:  91 tablet    Refill:  3    Order Specific Question:   Supervising Provider    Answer:   Tania Ade H [[id number]]  Check CBC,CMP,TSH and lipids,A1c and vitamin D and HIV and hepatitis C antibody Will stop jolessa at 55 and check [facility] Physical in 1 year, pap in 2020 Mammogram yearly Colonoscopy per GI

## 2016-05-30 ENCOUNTER — Telehealth: Payer: Self-pay | Admitting: Adult Health

## 2016-05-30 LAB — LIPID PANEL
Chol/HDL Ratio: 3.6 ratio (ref 0.0–4.4)
Cholesterol, Total: 201 mg/dL — ABNORMAL HIGH (ref 100–199)
HDL: 56 mg/dL (ref >39)
LDL Calculated: 112 mg/dL — ABNORMAL HIGH (ref 0–99)
Triglycerides: 164 mg/dL — ABNORMAL HIGH (ref 0–149)
VLDL Cholesterol Cal: 33 mg/dL (ref 5–40)

## 2016-05-30 LAB — COMPREHENSIVE METABOLIC PANEL
A/G RATIO: 1.8 (ref 1.2–2.2)
ALBUMIN: 4.5 g/dL (ref 3.5–5.5)
ALT: 14 IU/L (ref 0–32)
AST: 14 IU/L (ref 0–40)
Alkaline Phosphatase: 48 IU/L (ref 39–117)
BILIRUBIN TOTAL: 0.3 mg/dL (ref 0.0–1.2)
BUN / CREAT RATIO: 27 — AB (ref 9–23)
BUN: 20 mg/dL (ref 6–24)
CHLORIDE: 100 mmol/L (ref 96–106)
CO2: 24 mmol/L (ref 18–29)
Calcium: 9.6 mg/dL (ref 8.7–10.2)
Creatinine, Ser: 0.75 mg/dL (ref 0.57–1.00)
GFR calc Af Amer: 105 mL/min/{1.73_m2} (ref >59)
GFR calc non Af Amer: 91 mL/min/{1.73_m2} (ref >59)
Globulin, Total: 2.5 g/dL (ref 1.5–4.5)
Glucose: 90 mg/dL (ref 65–99)
Potassium: 4.3 mmol/L (ref 3.5–5.2)
Sodium: 138 mmol/L (ref 134–144)
Total Protein: 7 g/dL (ref 6.0–8.5)

## 2016-05-30 LAB — HIV ANTIBODY (ROUTINE TESTING W REFLEX): HIV Screen 4th Generation wRfx: NONREACTIVE

## 2016-05-30 LAB — CBC
Hematocrit: 40.9 % (ref 34.0–46.6)
Hemoglobin: 13.6 g/dL (ref 11.1–15.9)
MCH: 31.1 pg (ref 26.6–33.0)
MCHC: 33.3 g/dL (ref 31.5–35.7)
MCV: 94 fL (ref 79–97)
Platelets: 362 10*3/uL (ref 150–379)
RBC: 4.37 x10E6/uL (ref 3.77–5.28)
RDW: 13.1 % (ref 12.3–15.4)
WBC: 7.4 10*3/uL (ref 3.4–10.8)

## 2016-05-30 LAB — VITAMIN D 25 HYDROXY (VIT D DEFICIENCY, FRACTURES): VIT D 25 HYDROXY: 91 ng/mL (ref 30.0–100.0)

## 2016-05-30 LAB — HEMOGLOBIN A1C
Est. average glucose Bld gHb Est-mCnc: 108 mg/dL
HEMOGLOBIN A1C: 5.4 % (ref 4.8–5.6)

## 2016-05-30 LAB — TSH: TSH: 2.88 u[IU]/mL (ref 0.450–4.500)

## 2016-05-30 LAB — HEPATITIS C ANTIBODY: Hep C Virus Ab: <0.1 s/co ratio (ref 0.0–0.9)

## 2016-05-30 NOTE — Telephone Encounter (Signed)
Pt aware labs are good, can skip vitamin D on weekends

## 2016-06-26 ENCOUNTER — Other Ambulatory Visit: Payer: Self-pay | Admitting: Adult Health

## 2016-10-19 ENCOUNTER — Telehealth: Payer: Self-pay | Admitting: Adult Health

## 2016-10-19 NOTE — Telephone Encounter (Signed)
Ok to stop OCs now, use condoms, check [place] in 4 weeks

## 2016-10-19 NOTE — Telephone Encounter (Signed)
Patient wants to discuss going off her birth control. States she is going to another doctor for her increased weight gain who recommended she come off her BCP.

## 2016-10-19 NOTE — Telephone Encounter (Signed)
Patient called stating that she would like a call back from Jennifer, Patient did not state the reason why. Please contact pt °

## 2017-01-15 ENCOUNTER — Telehealth: Payer: Self-pay | Admitting: Adult Health

## 2017-01-15 NOTE — Telephone Encounter (Signed)
Saw Dr Anastasio Champion and placed on HRT, but had fast heart rate, and stopped, wants to discuss other options, make appt

## 2017-01-24 ENCOUNTER — Ambulatory Visit: Payer: PRIVATE HEALTH INSURANCE | Admitting: Adult Health

## 2017-01-24 ENCOUNTER — Encounter: Payer: Self-pay | Admitting: Adult Health

## 2017-01-24 ENCOUNTER — Encounter (INDEPENDENT_AMBULATORY_CARE_PROVIDER_SITE_OTHER): Payer: Self-pay

## 2017-01-24 VITALS — BP 120/80 | HR 98 | Ht 62.0 in | Wt 155.0 lb

## 2017-01-24 DIAGNOSIS — H9203 Otalgia, bilateral: Secondary | ICD-10-CM

## 2017-01-24 DIAGNOSIS — R61 Generalized hyperhidrosis: Secondary | ICD-10-CM

## 2017-01-24 DIAGNOSIS — Z79890 Hormone replacement therapy (postmenopausal): Secondary | ICD-10-CM | POA: Diagnosis not present

## 2017-01-24 MED ORDER — ESTRADIOL-NORETHINDRONE ACET 0.05-0.14 MG/DAY TD PTTW: 1.0000 | MEDICATED_PATCH | TRANSDERMAL | 12 refills | Status: DC

## 2017-01-24 NOTE — Patient Instructions (Signed)

## 2017-01-24 NOTE — Progress Notes (Signed)
Subjective:     Patient ID: Antionette Poles, female   DOB: [birth year], 57 y.o.   MRN: [id number]  HPI Minaal is a a 57 year old white female, PM in to discuss HRT was on oral from Dr Anastasio Champion but stopped.   Review of Systems +night sweats Ears feel funny had used swimmers ear drops  Reviewed past medical,surgical, social and family history. Reviewed medications and allergies.     Objective:   Physical Exam BP 120/80 (BP Location: Left Arm, Patient Position: Sitting, Cuff Size: Small)   Pulse 98   Ht 5\' 2"  (1.575 m)   Wt 155 lb (70.3 kg)   BMI 28.35 kg/m   PHQ 9 score 0, Ears clear, no fluid seen.Discussed HRT, she was on oral estrogen and progesterone but heart raced, will try patch.    Assessment:    HRT   Plan:     Meds ordered this encounter  Medications  . estradiol-norethindrone (COMBIPATCH) 0.05-0.14 MG/DAY    Sig: Place 1 patch onto the skin 2 (two) times a week.    Dispense:  8 patch    Refill:  12    Order Specific Question:   Supervising Provider    Answer:   Tania Ade H [[id number]]  F/U in 4 months Try claritin D or zyrtec D

## 2017-02-19 ENCOUNTER — Telehealth: Payer: Self-pay | Admitting: *Deleted

## 2017-02-19 NOTE — Telephone Encounter (Signed)
Left message x 1. JSY 

## 2017-02-20 ENCOUNTER — Telehealth: Payer: Self-pay | Admitting: Adult Health

## 2017-02-20 MED ORDER — ESTRADIOL-NORETHINDRONE ACET 0.05-0.25 MG/DAY TD PTTW: 1.0000 | MEDICATED_PATCH | TRANSDERMAL | 12 refills | Status: DC

## 2017-02-20 NOTE — Telephone Encounter (Signed)
Still sweating at night with combi patch will increase dose

## 2017-02-20 NOTE — Telephone Encounter (Signed)
Patient called stating that she was told by Anderson Malta if she needed to up her dosage for her medication she just needed to call and let her know. Pt states she uses [facility]. Please contact pt

## 2017-02-20 NOTE — Telephone Encounter (Signed)
Pt spoke with JAG. Encounter closed. JSY 

## 2017-05-24 ENCOUNTER — Ambulatory Visit: Payer: PRIVATE HEALTH INSURANCE | Admitting: Adult Health

## 2017-06-01 ENCOUNTER — Encounter (INDEPENDENT_AMBULATORY_CARE_PROVIDER_SITE_OTHER): Payer: Self-pay

## 2017-06-01 ENCOUNTER — Encounter: Payer: Self-pay | Admitting: Adult Health

## 2017-06-01 ENCOUNTER — Ambulatory Visit (INDEPENDENT_AMBULATORY_CARE_PROVIDER_SITE_OTHER): Payer: PRIVATE HEALTH INSURANCE | Admitting: Adult Health

## 2017-06-01 VITALS — BP 140/80 | HR 98 | Ht 61.2 in | Wt 150.0 lb

## 2017-06-01 DIAGNOSIS — Z1212 Encounter for screening for malignant neoplasm of rectum: Secondary | ICD-10-CM

## 2017-06-01 DIAGNOSIS — N951 Menopausal and female climacteric states: Secondary | ICD-10-CM

## 2017-06-01 DIAGNOSIS — R232 Flushing: Secondary | ICD-10-CM

## 2017-06-01 DIAGNOSIS — Z1211 Encounter for screening for malignant neoplasm of colon: Secondary | ICD-10-CM | POA: Diagnosis not present

## 2017-06-01 DIAGNOSIS — Z01411 Encounter for gynecological examination (general) (routine) with abnormal findings: Secondary | ICD-10-CM | POA: Diagnosis not present

## 2017-06-01 DIAGNOSIS — Z01419 Encounter for gynecological examination (general) (routine) without abnormal findings: Secondary | ICD-10-CM

## 2017-06-01 LAB — HEMOCCULT GUIAC POC 1CARD (OFFICE): Fecal Occult Blood, POC: NEGATIVE

## 2017-06-01 NOTE — Progress Notes (Signed)
Patient ID: Rhonda [name], female   DOB: 1960-01-30, 57 y.o.   MRN: [id number] History of Present Illness:  Rhonda [name] is a 57 year old white female, PM in for well woman gyn exam, she had normal pap with negative HPV 05/28/15. PCP is Rhonda [name].  Current Medications, Allergies, Past Medical History, Past Surgical History, Family History and Social History were reviewed in [name] record.     Review of Systems: Patient denies any headaches, hearing loss, fatigue, blurred vision, shortness of breath, chest pain, abdominal pain, problems with bowel movements, urination, or intercourse(not currently active). No joint pain or mood swings. Has hot flashes at night, stopped patches and just uses a fan.    Physical Exam:BP 140/80 (BP Location: Left Arm, Patient Position: Sitting, Cuff Size: Small)   Pulse 98   Ht 5' 1.2" (1.554 m)   Wt 150 lb (68 kg)   BMI 28.16 kg/m  General:  Well developed, well nourished, no acute distress Skin:  Warm and dry Neck:  Midline trachea, normal thyroid, good ROM, no lymphadenopathy Lungs; Clear to auscultation bilaterally Breast:  No dominant palpable mass, retraction, or nipple discharge Cardiovascular: Regular rate and rhythm Abdomen:  Soft, non tender, no hepatosplenomegaly Pelvic:  External genitalia is normal in appearance, no lesions.  The vagina is normal in appearance. Urethra has no lesions or masses. The cervix is smooth.  Uterus is felt to be normal size, shape, and contour.  No adnexal masses or tenderness noted.Bladder is non tender, no masses felt. Rectal: Good sphincter tone, no polyps, or hemorrhoids felt.  Hemoccult negative. Extremities/musculoskeletal:  No swelling or varicosities noted, no clubbing or cyanosis Psych:  No mood changes, alert and cooperative,seems happy PHQ 9 score 7, denies being suicidal, has xanax if needed.  Impression:  1. Encounter for well woman exam with routine gynecological exam   2.  Screening for colorectal cancer      Plan: Pap and physical in 1 year Mammogram yearly Labs at work

## 2017-06-15 ENCOUNTER — Ambulatory Visit: Payer: PRIVATE HEALTH INSURANCE | Admitting: Adult Health

## 2017-11-05 ENCOUNTER — Other Ambulatory Visit: Payer: Self-pay | Admitting: Adult Health

## 2017-12-12 ENCOUNTER — Telehealth: Payer: Self-pay | Admitting: *Deleted

## 2017-12-12 MED ORDER — SCOPOLAMINE 1 MG/3DAYS TD PT72: MEDICATED_PATCH | TRANSDERMAL | 0 refills | Status: DC

## 2017-12-12 NOTE — Telephone Encounter (Signed)
Pt traveling needs scop patch, will rx

## 2017-12-13 ENCOUNTER — Telehealth: Payer: Self-pay | Admitting: Obstetrics & Gynecology

## 2017-12-13 NOTE — Telephone Encounter (Signed)
Patient called, stated that she's out of town and that we called in two motion sickness patches, but she stated she needs one more 72 hour patch.  Patient is requesting a call back to let her know when it has been called in.  [facility] [place] [address] [phone]  [phone]

## 2017-12-14 ENCOUNTER — Telehealth: Payer: Self-pay | Admitting: *Deleted

## 2017-12-14 ENCOUNTER — Other Ambulatory Visit: Payer: Self-pay | Admitting: Adult Health

## 2017-12-14 NOTE — Telephone Encounter (Signed)
Spoke with pt. 2 patches was ordered but pt will need one more patch sent to pharmacy for train ride home. Thanks!! Rhonda [name]

## 2018-04-04 ENCOUNTER — Encounter: Payer: Self-pay | Admitting: *Deleted

## 2018-07-29 ENCOUNTER — Telehealth: Payer: Self-pay | Admitting: Adult Health

## 2018-07-29 NOTE — Telephone Encounter (Signed)

## 2018-07-30 ENCOUNTER — Other Ambulatory Visit: Payer: Self-pay

## 2018-07-30 ENCOUNTER — Ambulatory Visit (INDEPENDENT_AMBULATORY_CARE_PROVIDER_SITE_OTHER): Payer: PRIVATE HEALTH INSURANCE | Admitting: Adult Health

## 2018-07-30 ENCOUNTER — Encounter: Payer: Self-pay | Admitting: Adult Health

## 2018-07-30 ENCOUNTER — Other Ambulatory Visit (HOSPITAL_COMMUNITY)
Admission: RE | Admit: 2018-07-30 | Discharge: 2018-07-30 | Disposition: A | Discharge status: Home / Self Care | Payer: PRIVATE HEALTH INSURANCE | Source: Ambulatory Visit | Attending: Adult Health | Admitting: Adult Health

## 2018-07-30 VITALS — BP 146/84 | HR 78 | Ht 61.0 in | Wt 145.0 lb

## 2018-07-30 DIAGNOSIS — Z1212 Encounter for screening for malignant neoplasm of rectum: Secondary | ICD-10-CM | POA: Diagnosis not present

## 2018-07-30 DIAGNOSIS — Z1211 Encounter for screening for malignant neoplasm of colon: Secondary | ICD-10-CM

## 2018-07-30 DIAGNOSIS — Z01419 Encounter for gynecological examination (general) (routine) without abnormal findings: Secondary | ICD-10-CM | POA: Diagnosis present

## 2018-07-30 LAB — HEMOCCULT GUIAC POC 1CARD (OFFICE): Fecal Occult Blood, POC: NEGATIVE

## 2018-07-30 NOTE — Addendum Note (Signed)
Addended by: Christiana Pellant A on: 07/30/2018 11:54 AM   Modules accepted: Orders

## 2018-07-30 NOTE — Progress Notes (Signed)
Patient ID: Rhonda [name], female   DOB: [birth year], 58 y.o.   MRN: [id number] History of Present Illness: Rhonda [name] is a 58 year old white female, single, PM, in for a well woman gyn exam and pap.She works for [employer].  PCP is Dr Gerarda Fraction.    Current Medications, Allergies, Past Medical History, Past Surgical History, Family History and Social History were reviewed in [facility] record.     Review of Systems: Patient denies any headaches, hearing loss, fatigue, blurred vision, shortness of breath, chest pain, abdominal pain, problems with bowel movements, urination, or intercourse. No joint pain. Has hot flashes and some mood swings, but declines HRT.     Physical Exam:BP (!) 146/84 (BP Location: Left Arm, Patient Position: Sitting, Cuff Size: Normal)   Pulse 78   Ht 5\' 1"  (1.549 m)   Wt 145 lb (65.8 kg)   BMI 27.40 kg/m  General:  Well developed, well nourished, no acute distress Skin:  Warm and dry Neck:  Midline trachea, normal thyroid, good ROM, no lymphadenopathy Lungs; Clear to auscultation bilaterally Breast:  No dominant palpable mass, retraction, or nipple discharge Cardiovascular: Regular rate and rhythm Abdomen:  Soft, non tender, no hepatosplenomegaly Pelvic:  External genitalia is normal in appearance, no lesions.  The vagina is normal in appearance. Urethra has no lesions or masses. The cervix is smooth, pap with HPV 16/8 genotyping performed.  Uterus is felt to be normal size, shape, and contour.  No adnexal masses or tenderness noted.Bladder is non tender, no masses felt. Rectal: Good sphincter tone, no polyps, or hemorrhoids felt.  Hemoccult negative. Extremities/musculoskeletal:  No swelling or varicosities noted, no clubbing or cyanosis Psych:  No mood changes, alert and cooperative,seems happy Fall risk is low PHQ 2 score 0. Examination chaperoned by Estill Bamberg Rash LPN.   Impression: 1. Encounter for gynecological examination with  Papanicolaou smear of cervix   2. Screening for colorectal cancer       Plan: Pap with HPV 16/18 genotyping sent Physical in 1 year Pap in 3 if norma Mammogram yearly Colonoscopy per GI Labs with PCP and needs tetanus/Tdap

## 2018-07-31 LAB — CYTOLOGY - PAP
Diagnosis: NEGATIVE
HPV: NOT DETECTED

## 2018-11-25 ENCOUNTER — Telehealth: Payer: Self-pay | Admitting: *Deleted

## 2018-11-25 DIAGNOSIS — Z01419 Encounter for gynecological examination (general) (routine) without abnormal findings: Secondary | ICD-10-CM

## 2018-11-25 DIAGNOSIS — Z1321 Encounter for screening for nutritional disorder: Secondary | ICD-10-CM

## 2018-11-25 DIAGNOSIS — E7800 Pure hypercholesterolemia, unspecified: Secondary | ICD-10-CM

## 2018-11-25 DIAGNOSIS — Z8639 Personal history of other endocrine, nutritional and metabolic disease: Secondary | ICD-10-CM

## 2018-11-25 NOTE — Telephone Encounter (Signed)
Patient states she normally has her PCP do her annual labs but with COVID, she did not want to go into the office to have done.  She is requesting we get them done here at [place] before December.  I listed off the previous labwork she has had done with [place] but she states she normally has a "10 panel workup done".  Please advise.

## 2018-11-25 NOTE — Telephone Encounter (Signed)
Patient left message that she needs to schedule routine labs.

## 2018-11-26 NOTE — Addendum Note (Signed)
Addended by: Derrek Monaco A on: 11/26/2018 08:16 AM   Modules accepted: Orders

## 2018-11-26 NOTE — Telephone Encounter (Signed)
Left message order in for labs

## 2018-12-13 ENCOUNTER — Telehealth: Payer: Self-pay | Admitting: Adult Health

## 2018-12-13 LAB — LIPID PANEL
Chol/HDL Ratio: 3.2 ratio (ref 0.0–4.4)
Cholesterol, Total: 224 mg/dL — ABNORMAL HIGH (ref 100–199)
HDL: 70 mg/dL (ref >39)
LDL Chol Calc (NIH): 129 mg/dL — ABNORMAL HIGH (ref 0–99)
Triglycerides: 142 mg/dL (ref 0–149)
VLDL Cholesterol Cal: 25 mg/dL (ref 5–40)

## 2018-12-13 LAB — COMPREHENSIVE METABOLIC PANEL
ALT: 10 IU/L (ref 0–32)
AST: 15 IU/L (ref 0–40)
Albumin/Globulin Ratio: 2 (ref 1.2–2.2)
Albumin: 4.8 g/dL (ref 3.8–4.9)
Alkaline Phosphatase: 93 IU/L (ref 39–117)
BUN/Creatinine Ratio: 22 (ref 9–23)
BUN: 15 mg/dL (ref 6–24)
Bilirubin Total: 0.3 mg/dL (ref 0.0–1.2)
CO2: 29 mmol/L (ref 20–29)
Calcium: 9.7 mg/dL (ref 8.7–10.2)
Chloride: 100 mmol/L (ref 96–106)
Creatinine, Ser: 0.69 mg/dL (ref 0.57–1.00)
GFR calc Af Amer: 113 mL/min/{1.73_m2} (ref >59)
GFR calc non Af Amer: 98 mL/min/{1.73_m2} (ref >59)
Globulin, Total: 2.4 g/dL (ref 1.5–4.5)
Glucose: 108 mg/dL — ABNORMAL HIGH (ref 65–99)
Potassium: 4.2 mmol/L (ref 3.5–5.2)
Sodium: 141 mmol/L (ref 134–144)
Total Protein: 7.2 g/dL (ref 6.0–8.5)

## 2018-12-13 LAB — CBC
Hematocrit: 40.3 % (ref 34.0–46.6)
Hemoglobin: 13.5 g/dL (ref 11.1–15.9)
MCH: 30.9 pg (ref 26.6–33.0)
MCHC: 33.5 g/dL (ref 31.5–35.7)
MCV: 92 fL (ref 79–97)
Platelets: 288 10*3/uL (ref 150–450)
RBC: 4.37 x10E6/uL (ref 3.77–5.28)
RDW: 12 % (ref 11.7–15.4)
WBC: 4.4 10*3/uL (ref 3.4–10.8)

## 2018-12-13 LAB — HEMOGLOBIN A1C
Est. average glucose Bld gHb Est-mCnc: 120 mg/dL
Hgb A1c MFr Bld: 5.8 % — ABNORMAL HIGH (ref 4.8–5.6)

## 2018-12-13 LAB — TSH: TSH: 1.47 u[IU]/mL (ref 0.450–4.500)

## 2018-12-13 LAB — VITAMIN D 25 HYDROXY (VIT D DEFICIENCY, FRACTURES): Vit D, 25-Hydroxy: 58.6 ng/mL (ref 30.0–100.0)

## 2018-12-13 NOTE — Telephone Encounter (Signed)
Left message to call me Monday about labs

## 2018-12-16 ENCOUNTER — Telehealth: Payer: Self-pay | Admitting: Adult Health

## 2018-12-16 NOTE — Telephone Encounter (Signed)
Pt aware of labs, watch carbs. She had eaten this day she said

## 2019-02-19 ENCOUNTER — Other Ambulatory Visit: Payer: Self-pay | Admitting: Adult Health

## 2019-09-10 ENCOUNTER — Encounter: Payer: Self-pay | Admitting: Adult Health

## 2019-09-10 ENCOUNTER — Ambulatory Visit (INDEPENDENT_AMBULATORY_CARE_PROVIDER_SITE_OTHER): Payer: No Typology Code available for payment source | Admitting: Adult Health

## 2019-09-10 VITALS — BP 120/77 | HR 90 | Ht 62.0 in | Wt 145.0 lb

## 2019-09-10 DIAGNOSIS — Z01419 Encounter for gynecological examination (general) (routine) without abnormal findings: Secondary | ICD-10-CM | POA: Diagnosis not present

## 2019-09-10 DIAGNOSIS — Z1211 Encounter for screening for malignant neoplasm of colon: Secondary | ICD-10-CM | POA: Diagnosis not present

## 2019-09-10 DIAGNOSIS — R232 Flushing: Secondary | ICD-10-CM

## 2019-09-10 LAB — HEMOCCULT GUIAC POC 1CARD (OFFICE): Fecal Occult Blood, POC: NEGATIVE

## 2019-09-10 NOTE — Progress Notes (Signed)
Patient ID: Rhonda [name], female   DOB: May 28, 1960, 59 y.o.   MRN: [id number] History of Present Illness:  Rhonda [name] is a 59 year old white female,single, G2P1011, PM, in for well woman gyn exam, she had normal pap with negative HPV 07/30/2018. She is going to be grandma in January 2022. PCP is Dr Gerarda Fraction.   Current Medications, Allergies, Past Medical History, Past Surgical History, Family History and Social History were reviewed in [facility] record.     Review of Systems:  Patient denies any headaches, hearing loss, fatigue, blurred vision, shortness of breath, chest pain, abdominal pain, problems with bowel movements, urination, or intercourse. No joint pain or mood swings. Having hot flashes and using OTC form [place].   Physical Exam:BP 120/77 (BP Location: Left Arm, Patient Position: Sitting, Cuff Size: Normal)    Pulse 90    Ht 5\' 2"  (1.575 m)    Wt 145 lb (65.8 kg)    BMI 26.52 kg/m  General:  Well developed, well nourished, no acute distress Skin:  Warm and dry Neck:  Midline trachea, normal thyroid, good ROM, no lymphadenopathy Lungs; Clear to auscultation bilaterally Breast:  No dominant palpable mass, retraction, or nipple discharge Cardiovascular: Regular rate and rhythm Abdomen:  Soft, non tender, no hepatosplenomegaly Pelvic:  External genitalia is normal in appearance, no lesions.  The vagina is normal in appearance. Urethra has no lesions or masses. The cervix is smooth.  Uterus is felt to be normal size, shape, and contour.  No adnexal masses or tenderness noted.Bladder is non tender, no masses felt. Rectal: Good sphincter tone, no polyps, or hemorrhoids felt.  Hemoccult negative. Extremities/musculoskeletal:  No swelling or varicosities noted, no clubbing or cyanosis Psych:  No mood changes, alert and cooperative,seems happy AA is 2 Fall risk is low PHQ 9 score is 7, no SI  [facility] - 09/10/19 0848      Pregnancy Intention Screening   Does the  patient want to become pregnant in the next year? N/A    Does the patient's partner want to become pregnant in the next year? N/A    Would the patient like to discuss contraceptive options today? N/A      Contraception Wrap Up   Current Method No Method - Other Reason   postmenopausal   End Method No Method - Other Reason   postmenopausal   Contraception Counseling Provided No          Examination chaperoned by Levy Pupa LPN   Impression and Plan: 1. Encounter for well woman exam with routine gynecological exam Physical in 1 year  Pap in 2023 Labs at work Mammogram yearly  2. Encounter for screening fecal occult blood testing   3. Hot flashes Will get better Avoid a lot of alcohol and spicy foods

## 2019-10-31 ENCOUNTER — Telehealth: Payer: Self-pay | Admitting: Adult Health

## 2019-10-31 NOTE — Telephone Encounter (Signed)
Patient called stating that she would like for Anderson Malta to place an order for her to have blood work done. Patient would like it to be sent to [facility]. Please contact pt

## 2019-10-31 NOTE — Telephone Encounter (Signed)
Pt wants to have wellness labs drawn the week of 12/6-10/21. Pt was advised she needs to be fasting. Call transferred to [facility] for lab appt. [place]

## 2019-11-19 ENCOUNTER — Ambulatory Visit (INDEPENDENT_AMBULATORY_CARE_PROVIDER_SITE_OTHER): Payer: No Typology Code available for payment source | Admitting: Dermatology

## 2019-11-19 ENCOUNTER — Other Ambulatory Visit: Payer: Self-pay

## 2019-11-19 ENCOUNTER — Encounter: Payer: Self-pay | Admitting: Dermatology

## 2019-11-19 DIAGNOSIS — L821 Other seborrheic keratosis: Secondary | ICD-10-CM | POA: Diagnosis not present

## 2019-11-19 DIAGNOSIS — D1801 Hemangioma of skin and subcutaneous tissue: Secondary | ICD-10-CM

## 2019-11-19 DIAGNOSIS — Z1283 Encounter for screening for malignant neoplasm of skin: Secondary | ICD-10-CM

## 2019-11-19 DIAGNOSIS — Z86018 Personal history of other benign neoplasm: Secondary | ICD-10-CM

## 2019-12-02 NOTE — Progress Notes (Signed)
   Follow-Up Visit   Subjective  Rhonda [name] is a 59 y.o. female who presents for the following: Skin Problem (Check place on left side x 1 year.  No bleeding, getting larger, no itch no crust.).  Annual skin check Location: New spot left lower side Duration:  Quality:  Associated Signs/Symptoms: Modifying Factors:  Severity:  Timing: Context: History of atypical pigmented lesion  Objective  Well appearing patient in no apparent distress; mood and affect are within normal limits.  All skin waist up examined.   Assessment & Plan    History of atypical skin mole (5) Left Upper Arm - Anterior; Left Breast; Right Abdomen (side) - Lower; Right Inframammary Fold; Right Lower Back  Yearly skin check plus she should check her own skin twice annually.  Hemangioma of skin Mid Back  Benign okay to leave.  Seborrheic keratosis Left Flank  Benign okay to leave unless patient wants removed.  Or clinically changes.  Skin exam for malignant neoplasm Chest - Medial [facility])  Yearly skin check     I, Lavonna Monarch, MD, have reviewed all documentation for this visit.  The documentation on 12/02/19 for the exam, diagnosis, procedures, and orders are all accurate and complete.

## 2019-12-16 ENCOUNTER — Other Ambulatory Visit: Payer: PRIVATE HEALTH INSURANCE

## 2019-12-17 LAB — LIPID PANEL
Chol/HDL Ratio: 3.3 ratio (ref 0.0–4.4)
Cholesterol, Total: 235 mg/dL — ABNORMAL HIGH (ref 100–199)
HDL: 72 mg/dL (ref >39)
LDL Chol Calc (NIH): 139 mg/dL — ABNORMAL HIGH (ref 0–99)
Triglycerides: 135 mg/dL (ref 0–149)
VLDL Cholesterol Cal: 24 mg/dL (ref 5–40)

## 2019-12-17 LAB — CBC
Hematocrit: 40 % (ref 34.0–46.6)
Hemoglobin: 13.6 g/dL (ref 11.1–15.9)
MCH: 31.1 pg (ref 26.6–33.0)
MCHC: 34 g/dL (ref 31.5–35.7)
MCV: 92 fL (ref 79–97)
Platelets: 327 10*3/uL (ref 150–450)
RBC: 4.37 x10E6/uL (ref 3.77–5.28)
RDW: 12.3 % (ref 11.7–15.4)
WBC: 6.1 10*3/uL (ref 3.4–10.8)

## 2019-12-17 LAB — COMPREHENSIVE METABOLIC PANEL
ALT: 10 IU/L (ref 0–32)
AST: 9 IU/L (ref 0–40)
Albumin/Globulin Ratio: 2 (ref 1.2–2.2)
Albumin: 4.6 g/dL (ref 3.8–4.9)
Alkaline Phosphatase: 80 IU/L (ref 44–121)
BUN/Creatinine Ratio: 37 — ABNORMAL HIGH (ref 9–23)
BUN: 27 mg/dL — ABNORMAL HIGH (ref 6–24)
Bilirubin Total: 0.3 mg/dL (ref 0.0–1.2)
CO2: 20 mmol/L (ref 20–29)
Calcium: 9.5 mg/dL (ref 8.7–10.2)
Chloride: 101 mmol/L (ref 96–106)
Creatinine, Ser: 0.73 mg/dL (ref 0.57–1.00)
GFR calc Af Amer: 106 mL/min/{1.73_m2} (ref >59)
GFR calc non Af Amer: 92 mL/min/{1.73_m2} (ref >59)
Globulin, Total: 2.3 g/dL (ref 1.5–4.5)
Glucose: 106 mg/dL — ABNORMAL HIGH (ref 65–99)
Potassium: 4.6 mmol/L (ref 3.5–5.2)
Sodium: 136 mmol/L (ref 134–144)
Total Protein: 6.9 g/dL (ref 6.0–8.5)

## 2019-12-17 LAB — VITAMIN D 25 HYDROXY (VIT D DEFICIENCY, FRACTURES): Vit D, 25-Hydroxy: 61.3 ng/mL (ref 30.0–100.0)

## 2019-12-17 LAB — HEMOGLOBIN A1C
Est. average glucose Bld gHb Est-mCnc: 120 mg/dL
Hgb A1c MFr Bld: 5.8 % — ABNORMAL HIGH (ref 4.8–5.6)

## 2019-12-17 LAB — TSH: TSH: 1.45 u[IU]/mL (ref 0.450–4.500)

## 2020-08-23 ENCOUNTER — Other Ambulatory Visit: Payer: Self-pay | Admitting: Adult Health

## 2020-10-11 ENCOUNTER — Encounter: Payer: Self-pay | Admitting: Adult Health

## 2020-10-11 ENCOUNTER — Other Ambulatory Visit: Payer: No Typology Code available for payment source | Admitting: Adult Health

## 2020-10-11 ENCOUNTER — Other Ambulatory Visit: Payer: Self-pay

## 2020-10-11 ENCOUNTER — Ambulatory Visit (INDEPENDENT_AMBULATORY_CARE_PROVIDER_SITE_OTHER): Payer: No Typology Code available for payment source | Admitting: Adult Health

## 2020-10-11 VITALS — BP 121/73 | HR 102 | Ht 62.0 in | Wt 146.0 lb

## 2020-10-11 DIAGNOSIS — Z01419 Encounter for gynecological examination (general) (routine) without abnormal findings: Secondary | ICD-10-CM

## 2020-10-11 DIAGNOSIS — Z1211 Encounter for screening for malignant neoplasm of colon: Secondary | ICD-10-CM | POA: Diagnosis not present

## 2020-10-11 DIAGNOSIS — Z780 Asymptomatic menopausal state: Secondary | ICD-10-CM | POA: Diagnosis not present

## 2020-10-11 LAB — HEMOCCULT GUIAC POC 1CARD (OFFICE): Fecal Occult Blood, POC: NEGATIVE

## 2020-10-11 NOTE — Progress Notes (Signed)
Patient ID: DAMILOLA [name], female   DOB: 1960/08/30, 60 y.o.   MRN: [id number] History of Present Illness: Rhonda [name] is a 60 year old white female,single, PM in for a well woman gyn exam. Lab Results  Component Value Date   DIAGPAP  07/30/2018    NEGATIVE FOR INTRAEPITHELIAL LESIONS OR MALIGNANCY.   HPV NOT Detected 07/30/2018    PCP is Dr Gerarda Fraction.  Current Medications, Allergies, Past Medical History, Past Surgical History, Family History and Social History were reviewed in [place] record.     Review of Systems:  Patient denies any headaches, hearing loss, fatigue, blurred vision, shortness of breath, chest pain, abdominal pain, problems with bowel movements, urination, or intercourse. No joint pain or mood swings.    Physical Exam:BP 121/73 (BP Location: Left Arm, Patient Position: Sitting, Cuff Size: Normal)   Pulse (!) 102   Ht 5\' 2"  (1.575 m)   Wt 146 lb (66.2 kg)   BMI 26.70 kg/m   General:  Well developed, well nourished, no acute distress Skin:  Warm and dry Neck:  Midline trachea, normal thyroid, good ROM, no lymphadenopathy Lungs; Clear to auscultation bilaterally Breast:  No dominant palpable mass, retraction, or nipple discharge Cardiovascular: Regular rate and rhythm Abdomen:  Soft, non tender, no hepatosplenomegaly Pelvic:  External genitalia is normal in appearance, no lesions.  The vagina is normal in appearance. Urethra has no lesions or masses. The cervix is smooth..  Uterus is felt to be normal size, shape, and contour.  No adnexal masses or tenderness noted.Bladder is non tender, no masses felt. Rectal: Good sphincter tone, no polyps, or hemorrhoids felt.  Hemoccult negative. Extremities/musculoskeletal:  No swelling or varicosities noted, no clubbing or cyanosis Psych:  No mood changes, alert and cooperative,seems happy AA is 3 Fall risk is low Depression screen [facility] 2/9 10/11/2020 09/10/2019 07/30/2018  Decreased Interest 0 1 0  Down,  Depressed, Hopeless 0 0 0  PHQ - 2 Score 0 1 0  Altered sleeping 0 3 -  Tired, decreased energy 0 1 -  Change in appetite 0 0 -  Feeling bad or failure about yourself  0 0 -  Trouble concentrating 0 2 -  Moving slowly or fidgety/restless 0 0 -  Suicidal thoughts 0 0 -  PHQ-9 Score 0 7 -  Difficult doing work/chores - Not difficult at all -    GAD 7 : Generalized Anxiety Score 10/11/2020 09/10/2019  Nervous, Anxious, on Edge 0 1  Control/stop worrying 0 1  Worry too much - different things 0 1  Trouble relaxing 0 1  Restless 0 1  Easily annoyed or irritable 0 0  Afraid - awful might happen 0 0  Total GAD 7 Score 0 5  Anxiety Difficulty - Not difficult at all      [facility] - 10/11/20 0943       Pregnancy Intention Screening   Does the patient want to become pregnant in the next year? N/A    Does the patient's partner want to become pregnant in the next year? N/A    Would the patient like to discuss contraceptive options today? N/A      Contraception Wrap Up   Current Method --   Postmenopausal   End Method --   Postmenopausal   Contraception Counseling Provided No               Examination chaperoned by Celene Squibb LPN  Impression and Plan: 1. Encounter for well woman exam  with routine gynecological exam Pap and physical in 1 year Has mammogram scheduled Labs with PCP  2. Encounter for screening fecal occult blood testing   3. Postmenopausal

## 2020-12-13 ENCOUNTER — Ambulatory Visit (INDEPENDENT_AMBULATORY_CARE_PROVIDER_SITE_OTHER): Payer: No Typology Code available for payment source | Admitting: Dermatology

## 2020-12-13 ENCOUNTER — Other Ambulatory Visit: Payer: Self-pay

## 2020-12-13 ENCOUNTER — Encounter: Payer: Self-pay | Admitting: Dermatology

## 2020-12-13 DIAGNOSIS — Z1283 Encounter for screening for malignant neoplasm of skin: Secondary | ICD-10-CM | POA: Diagnosis not present

## 2020-12-13 DIAGNOSIS — L821 Other seborrheic keratosis: Secondary | ICD-10-CM

## 2020-12-13 DIAGNOSIS — M720 Palmar fascial fibromatosis [Dupuytren]: Secondary | ICD-10-CM | POA: Diagnosis not present

## 2020-12-13 DIAGNOSIS — D1801 Hemangioma of skin and subcutaneous tissue: Secondary | ICD-10-CM

## 2021-01-02 ENCOUNTER — Encounter: Payer: Self-pay | Admitting: Dermatology

## 2021-01-02 NOTE — Progress Notes (Signed)
° °  Follow-Up Visit   Subjective  Rhonda [name] is a 60 y.o. female who presents for the following: Skin Problem (2 spots on left breast- she wants checked).  General skin examination, several spots of concern Location:  Duration:  Quality:  Associated Signs/Symptoms: Modifying Factors:  Severity:  Timing: Context:   Objective  Well appearing patient in no apparent distress; mood and affect are within normal limits. Mid Back, Right Flank Smooth red 3 mm dermal papules  Left Hypothenar [place], Left Mid Palm, Left Thenar [place], Right Hypothenar Eminence, Right Mid Palm, Right Thenar [place]minence Thickening of the lateral palmar endons  Left Anterior Mandible, Left Breast (2), Right Inframammary Fold Flattopped brown 4 to 6 mm textured papules  Waist Up Waist up exam today no signs of atypical moles, melanoma or non mole skin cancer.    All skin waist up examined.  Areas beneath undergarments not fully examined   Assessment & Plan    Hemangioma of skin (2) Right Flank; Mid Back  Benign okay to leave.  Dupuytren's contracture of both hands (6) Left Thenar Eminence; Right Thenar Eminence; Left Hypothenar Eminence; Right Hypothenar Eminence; Left Mid Palm; Right Mid Palm  Discussed consultation with hand surgeon and treatment options  Seborrheic keratosis (4) Left Breast (2); Right Inframammary Fold; Left Anterior Mandible  May leave if stable  Skin exam for malignant neoplasm Waist Up  Yearly skin check.       I, Lavonna Monarch, MD, have reviewed all documentation for this visit.  The documentation on 01/02/21 for the exam, diagnosis, procedures, and orders are all accurate and complete.

## 2021-11-14 ENCOUNTER — Ambulatory Visit (INDEPENDENT_AMBULATORY_CARE_PROVIDER_SITE_OTHER): Payer: No Typology Code available for payment source | Admitting: Adult Health

## 2021-11-14 ENCOUNTER — Other Ambulatory Visit (HOSPITAL_COMMUNITY)
Admission: RE | Admit: 2021-11-14 | Discharge: 2021-11-14 | Disposition: A | Discharge status: Home / Self Care | Payer: No Typology Code available for payment source | Source: Ambulatory Visit | Attending: Adult Health | Admitting: Adult Health

## 2021-11-14 ENCOUNTER — Encounter: Payer: Self-pay | Admitting: Adult Health

## 2021-11-14 VITALS — BP 128/94 | HR 99 | Ht 61.0 in | Wt 157.5 lb

## 2021-11-14 DIAGNOSIS — Z01419 Encounter for gynecological examination (general) (routine) without abnormal findings: Secondary | ICD-10-CM

## 2021-11-14 DIAGNOSIS — Z1211 Encounter for screening for malignant neoplasm of colon: Secondary | ICD-10-CM

## 2021-11-14 DIAGNOSIS — Z780 Asymptomatic menopausal state: Secondary | ICD-10-CM

## 2021-11-14 LAB — HEMOCCULT GUIAC POC 1CARD (OFFICE): Fecal Occult Blood, POC: NEGATIVE

## 2021-11-14 NOTE — Progress Notes (Signed)
Patient ID: Rhonda [name], female   DOB: [birth year], 61 y.o.   MRN: [id number] History of Present Illness: Rhonda [name] is a 61 year old white female,single, PM in for a well woman gyn exam and pap. 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  PCP is Dr Gerarda Fraction.   Current Medications, Allergies, Past Medical History, Past Surgical History, Family History and Social History were reviewed in Rhonda [name] record.     Review of Systems: Patient denies any headaches, hearing loss, fatigue, blurred vision, shortness of breath, chest pain, abdominal pain, problems with bowel movements, urination, or intercourse. No joint pain or mood swings.  Denies any vaginal bleeding    Physical Exam:BP (!) 128/94 (BP Location: Right Arm, Patient Position: Sitting, Cuff Size: Normal)   Pulse 99   Ht '5\' 1"'$  (1.549 m)   Wt 157 lb 8 oz (71.4 kg)   BMI 29.76 kg/m   General:  Well developed, well nourished, no acute distress Skin:  Warm and dry Neck:  Midline trachea, normal thyroid, good ROM, no lymphadenopathy Lungs; Clear to auscultation bilaterally Breast:  No dominant palpable mass, retraction, or nipple discharge Cardiovascular: Regular rate and rhythm Abdomen:  Soft, non tender, no hepatosplenomegaly Pelvic:  External genitalia is normal in appearance, no lesions.  The vagina is pale. Urethra has no lesions or masses. The cervix is smooth, pap with HR HPV genotyping performed.  Uterus is felt to be normal size, shape, and contour.  No adnexal masses or tenderness noted.Bladder is non tender, no masses felt. Rectal: Good sphincter tone, no polyps, or hemorrhoids felt.  Hemoccult negative. Extremities/musculoskeletal:  No swelling or varicosities noted, no clubbing or cyanosis Psych:  No mood changes, alert and cooperative,seems happy AA [place] 2 Fall risk is low    11/14/2021    9:42 AM 10/11/2020    9:43 AM 09/10/2019    8:49 AM  Depression screen PHQ 2/9   Decreased Interest 0 0 1  Down, Depressed, Hopeless 0 0 0  PHQ - 2 Score 0 0 1  Altered sleeping 3 0 3  Tired, decreased energy 2 0 1  Change in appetite 1 0 0  Feeling bad or failure about yourself  0 0 0  Trouble concentrating 0 0 2  Moving slowly or fidgety/restless 0 0 0  Suicidal thoughts 0 0 0  PHQ-9 Score 6 0 7  Difficult doing work/chores   Not difficult at all       11/14/2021    9:42 AM 10/11/2020    9:44 AM 09/10/2019    8:50 AM  GAD 7 : Generalized Anxiety Score  Nervous, Anxious, on Edge 3 0 1  Control/stop worrying 3 0 1  Worry too much - different things 3 0 1  Trouble relaxing 1 0 1  Restless 0 0 1  Easily annoyed or irritable 0 0 0  Afraid - awful might happen 0 0 0  Total GAD 7 Score 10 0 5  Anxiety Difficulty   Not difficult at all    She declines meds says it is related to her mom,has xanax prn   Upstream - 11/14/21 4098       Pregnancy Intention Screening   Does the patient want to become pregnant in the next year? No    Does the patient's partner want to become pregnant in the next year? No    Would the patient like to discuss contraceptive options today? No  Contraception Wrap Up   Current Method No Method - Other Reason   postmenopausal   End Method No Method - Other Reason   postmenopausal   Contraception Counseling Provided No            Examination chaperoned by Levy Pupa LPN   Impression and Plan: 1. Encounter for gynecological examination with Papanicolaou smear of cervix Pap sent Pap in 3 years if negative Physical in 1 year Labs with PCP Mammogram this Thursday  Colonoscopy per GI Had flu shot 11/07/21 at PCP Stay active   2. Encounter for screening fecal occult blood testing Hemoccult was negative  3. Postmenopausal

## 2021-11-16 LAB — CYTOLOGY - PAP
Adequacy: ABSENT
Comment: NEGATIVE
Diagnosis: NEGATIVE
High risk HPV: NEGATIVE

## 2021-12-13 ENCOUNTER — Ambulatory Visit: Payer: No Typology Code available for payment source | Admitting: Dermatology

## 2022-11-17 ENCOUNTER — Ambulatory Visit: Payer: No Typology Code available for payment source | Admitting: Adult Health

## 2022-11-17 ENCOUNTER — Encounter: Payer: Self-pay | Admitting: Adult Health

## 2022-11-17 VITALS — BP 137/86 | HR 90 | Ht 61.0 in | Wt 146.5 lb

## 2022-11-17 DIAGNOSIS — R232 Flushing: Secondary | ICD-10-CM | POA: Diagnosis not present

## 2022-11-17 DIAGNOSIS — Z1211 Encounter for screening for malignant neoplasm of colon: Secondary | ICD-10-CM | POA: Diagnosis not present

## 2022-11-17 DIAGNOSIS — Z780 Asymptomatic menopausal state: Secondary | ICD-10-CM

## 2022-11-17 DIAGNOSIS — Z1331 Encounter for screening for depression: Secondary | ICD-10-CM | POA: Diagnosis not present

## 2022-11-17 DIAGNOSIS — Z01419 Encounter for gynecological examination (general) (routine) without abnormal findings: Secondary | ICD-10-CM

## 2022-11-17 LAB — HEMOCCULT GUIAC POC 1CARD (OFFICE): Fecal Occult Blood, POC: NEGATIVE

## 2022-11-17 NOTE — Progress Notes (Signed)
Patient ID: [name], female   DOB: [birth year], 62 y.o.   MRN: [id number] History of Present Illness: Rhonda [name] is a 62 year old white female,single, PM in for a well woman gyn exam.  PCP is Dr Rhonda [name]   Current Medications, Allergies, Past Medical History, Past Surgical History, Family History and Social History were reviewed in [employer] electronic medical record.     Review of Systems: Patient denies any headaches, hearing loss, fatigue, blurred vision, shortness of breath, chest pain, abdominal pain, problems with bowel movements, urination, or intercourse(not active). No joint pain or [name] swings.  Denies any vaginal bleeding, still has hot flashes    Physical Exam:BP 137/86 (BP Location: Left Arm, Patient Position: Sitting, Cuff Size: Normal)   Pulse 90   Ht 5\' 1"  (1.549 m)   Wt 146 lb 8 oz (66.5 kg)   BMI 27.68 kg/m   General:  Well developed, well nourished, no acute distress Skin:  Warm and dry Neck:  Midline trachea, normal thyroid, good ROM, no lymphadenopathy,no carotid bruits heard  Lungs; Clear to auscultation bilaterally Breast:  No dominant palpable mass, retraction, or nipple discharge Cardiovascular: Regular rate and rhythm Abdomen:  Soft, non tender, no hepatosplenomegaly Pelvic:  External genitalia is normal in appearance, no lesions.  The vagina is normal in appearance. Urethra has no lesions or masses. The cervix is smooth.  Uterus is felt to be normal size, shape, and contour.  No adnexal masses or tenderness noted.Bladder is non tender, no masses felt. Rectal: Good sphincter tone, no polyps, or hemorrhoids felt.  Hemoccult negative. Extremities/musculoskeletal:  No swelling or varicosities noted, no clubbing or cyanosis Psych:  No [name] changes, alert and cooperative,seems happy AA is 2 Fall risk is low    11/17/2022    9:47 AM 11/14/2021    9:42 AM 10/11/2020    9:43 AM  Depression screen PHQ 2/9  Decreased Interest 0 0 0  Down, Depressed,  Hopeless 0 0 0  PHQ - 2 Score 0 0 0  Altered sleeping 0 3 0  Tired, decreased energy 0 2 0  Change in appetite 0 1 0  Feeling bad or failure about yourself  0 0 0  Trouble concentrating 0 0 0  Moving slowly or fidgety/restless 0 0 0  Suicidal thoughts 0 0 0  PHQ-9 Score 0 6 0       11/17/2022    9:47 AM 11/14/2021    9:42 AM 10/11/2020    9:44 AM 09/10/2019    8:50 AM  GAD 7 : Generalized Anxiety Score  Nervous, Anxious, on Edge 1 3 0 1  Control/stop worrying 0 3 0 1  Worry too much - different things 0 3 0 1  Trouble relaxing 0 1 0 1  Restless 0 0 0 1  Easily annoyed or irritable 0 0 0 0  Afraid - awful might happen 0 0 0 0  Total GAD 7 Score 1 10 0 5  Anxiety Difficulty    Not difficult at all      Upstream - 11/17/22 0946       Pregnancy Intention Screening   Does the patient want to become pregnant in the next year? No    Does the patient's partner want to become pregnant in the next year? No    Would the patient like to discuss contraceptive options today? No      Contraception Wrap Up   Current Method Abstinence   PM   End Method  Abstinence   PM   Contraception Counseling Provided No            Examination chaperoned by Rhonda Mood LPN   Impression and Plan: 1. Encounter for well woman exam with routine gynecological exam Pap in 2026 Physical in 1 year Labs with PCP Mammogram next week Colonoscopy per PCP Stay active   2. Encounter for screening fecal occult blood testing Hemoccult was negative  - POCT occult blood stool  3. Postmenopausal Denies any vaginal bleeding  4. Hot flashes Still has hot flashes occasionally

## 2022-11-23 ENCOUNTER — Telehealth: Payer: Self-pay | Admitting: Adult Health

## 2022-11-23 NOTE — Telephone Encounter (Signed)
Left message mammogram was negative

## 2024-01-21 ENCOUNTER — Other Ambulatory Visit (HOSPITAL_COMMUNITY)
Admission: RE | Admit: 2024-01-21 | Discharge: 2024-01-21 | Disposition: A | Discharge status: Home / Self Care | Source: Ambulatory Visit | Attending: Adult Health | Admitting: Adult Health

## 2024-01-21 ENCOUNTER — Encounter (INDEPENDENT_AMBULATORY_CARE_PROVIDER_SITE_OTHER): Payer: Self-pay | Admitting: *Deleted

## 2024-01-21 ENCOUNTER — Ambulatory Visit: Admitting: Adult Health

## 2024-01-21 ENCOUNTER — Encounter: Payer: Self-pay | Admitting: Adult Health

## 2024-01-21 VITALS — BP 116/81 | HR 142 | Ht 61.5 in | Wt 160.0 lb

## 2024-01-21 DIAGNOSIS — Z01419 Encounter for gynecological examination (general) (routine) without abnormal findings: Secondary | ICD-10-CM | POA: Insufficient documentation

## 2024-01-21 DIAGNOSIS — Z1211 Encounter for screening for malignant neoplasm of colon: Secondary | ICD-10-CM

## 2024-01-21 DIAGNOSIS — Z1151 Encounter for screening for human papillomavirus (HPV): Secondary | ICD-10-CM

## 2024-01-21 NOTE — Progress Notes (Signed)
 Patient ID: Rhonda [name], female   DOB: [birth year], 64 y.o.   MRN: [id number] History of Present Illness:  Rhonda [name] is a 64 year old white female, single, PM in for a well woman gyn exam and pap. She is still working.  PCP is Dr Shona  Current Medications, Allergies, Past Medical History, Past Surgical History, Family History and Social History were reviewed in [employer] electronic medical record.     Review of Systems: Patient denies any headaches, hearing loss, fatigue, blurred vision, shortness of breath, chest pain, abdominal pain, problems with bowel movements, urination, or intercourse.(Not active) No joint pain or mood swings.  Denies any vaginal bleeding, has occasional LLQ pain Hot flashes just when gets mad   Physical Exam:BP 116/81 (BP Location: Left Arm, Patient Position: Sitting, Cuff Size: Normal)   Pulse (!) 142   Ht 5' 1.5 (1.562 m)   Wt 160 lb (72.6 kg)   BMI 29.74 kg/m   General:  Well developed, well nourished, no acute distress Skin:  Warm and dry Neck:  Midline trachea, normal thyroid, good ROM, no lymphadenopathy, no carotid bruits heard  Lungs; Clear to auscultation bilaterally Breast:  No dominant palpable mass, retraction, or nipple discharge Cardiovascular: Regular rate and rhythm Abdomen:  Soft, non tender, no hepatosplenomegaly Pelvic:  External genitalia is normal in appearance, no lesions.  The vagina is normal in appearance. Urethra has no lesions or masses. The cervix is smooth, pap with HR HPV genotyping performed.  Uterus is felt to be normal size, shape, and contour.  No adnexal masses or tenderness noted.Bladder is non tender, no masses felt. Rectal: Deferred Extremities/musculoskeletal:  No swelling or varicosities noted, no clubbing or cyanosis Psych:  No mood changes, alert and cooperative,seems happy AA is 2 Fall risk is low    01/21/2024   10:57 AM 11/17/2022    9:47 AM 11/14/2021    9:42 AM  Depression screen PHQ 2/9  Decreased  Interest 0 0 0  Down, Depressed, Hopeless 0 0 0  PHQ - 2 Score 0 0 0  Altered sleeping 1 0 3  Tired, decreased energy 1 0 2  Change in appetite 0 0 1  Feeling bad or failure about yourself  0 0 0  Trouble concentrating 0 0 0  Moving slowly or fidgety/restless 0 0 0  Suicidal thoughts 0 0 0  PHQ-9 Score 2 0  6      Data saved with a previous flowsheet row definition       01/21/2024   10:57 AM 11/17/2022    9:47 AM 11/14/2021    9:42 AM 10/11/2020    9:44 AM  GAD 7 : Generalized Anxiety Score  Nervous, Anxious, on Edge 1 1 3  0  Control/stop worrying 1 0 3 0  Worry too much - different things 1 0 3 0  Trouble relaxing 1 0 1 0  Restless 0 0 0 0  Easily annoyed or irritable 0 0 0 0  Afraid - awful might happen 0 0 0 0  Total GAD 7 Score 4 1 10  0      Upstream - 01/21/24 1046       Pregnancy Intention Screening   Does the patient want to become pregnant in the next year? N/A    Does the patient's partner want to become pregnant in the next year? N/A    Would the patient like to discuss contraceptive options today? N/A      Contraception Wrap Up  Current Method Post-Menopause    End Method Post-Menopause    Contraception Counseling Provided No          Examination chaperoned by Clarita Salt LPN  Impression and plan: 1. Encounter for gynecological examination with Papanicolaou smear of cervix (Primary) Pap sent Pap in 3 years if negative Physical in 1 year Labs with PCP Mammogram was negative 11/27/23  - Cytology - PAP( [facility])  2. Screening for colorectal cancer Referred to Dr Cindie for colonoscopy   - Ambulatory referral to Gastroenterology

## 2024-01-23 ENCOUNTER — Ambulatory Visit: Payer: Self-pay | Admitting: Adult Health

## 2024-01-23 LAB — CYTOLOGY - PAP
Adequacy: ABSENT
Comment: NEGATIVE
Diagnosis: NEGATIVE
High risk HPV: NEGATIVE
